# Patient Record
Sex: Male | Born: 1980
Health system: Southern US, Community
[De-identification: ages and names within clinical notes are randomized; demographics above are authoritative.]

## PROBLEM LIST (undated history)

## (undated) DIAGNOSIS — R112 Nausea with vomiting, unspecified: Secondary | ICD-10-CM

## (undated) DIAGNOSIS — Z9889 Other specified postprocedural states: Secondary | ICD-10-CM

## (undated) DIAGNOSIS — M87051 Idiopathic aseptic necrosis of right femur: Secondary | ICD-10-CM

## (undated) DIAGNOSIS — I10 Essential (primary) hypertension: Secondary | ICD-10-CM

## (undated) DIAGNOSIS — M166 Other bilateral secondary osteoarthritis of hip: Secondary | ICD-10-CM

## (undated) DIAGNOSIS — J45909 Unspecified asthma, uncomplicated: Secondary | ICD-10-CM

## (undated) HISTORY — PX: NO PAST SURGERIES: SHX2092

## (undated) HISTORY — PX: HIP SURGERY: SHX245

## (undated) HISTORY — PX: ANKLE FRACTURE SURGERY: SHX122

---

## 1999-10-27 HISTORY — PX: HIP PINNING: SHX1757

## 2009-12-26 HISTORY — PX: ANKLE CLOSED REDUCTION: SHX880

## 2013-12-23 ENCOUNTER — Encounter (HOSPITAL_COMMUNITY): Payer: Self-pay | Admitting: Emergency Medicine

## 2013-12-23 ENCOUNTER — Inpatient Hospital Stay (HOSPITAL_COMMUNITY)
Admission: EM | Admit: 2013-12-23 | Discharge: 2013-12-25 | DRG: 918 | Disposition: A | Discharge status: Other Acute Inpatient Hospital | Payer: Medicare Other | Attending: Internal Medicine | Admitting: Internal Medicine

## 2013-12-23 DIAGNOSIS — T50902A Poisoning by unspecified drugs, medicaments and biological substances, intentional self-harm, initial encounter: Secondary | ICD-10-CM

## 2013-12-23 DIAGNOSIS — I10 Essential (primary) hypertension: Secondary | ICD-10-CM | POA: Diagnosis present

## 2013-12-23 DIAGNOSIS — T4271XA Poisoning by unspecified antiepileptic and sedative-hypnotic drugs, accidental (unintentional), initial encounter: Principal | ICD-10-CM

## 2013-12-23 DIAGNOSIS — T43591A Poisoning by other antipsychotics and neuroleptics, accidental (unintentional), initial encounter: Secondary | ICD-10-CM | POA: Diagnosis present

## 2013-12-23 DIAGNOSIS — F17200 Nicotine dependence, unspecified, uncomplicated: Secondary | ICD-10-CM | POA: Diagnosis present

## 2013-12-23 DIAGNOSIS — Z87828 Personal history of other (healed) physical injury and trauma: Secondary | ICD-10-CM

## 2013-12-23 DIAGNOSIS — J45909 Unspecified asthma, uncomplicated: Secondary | ICD-10-CM | POA: Diagnosis present

## 2013-12-23 DIAGNOSIS — T50901A Poisoning by unspecified drugs, medicaments and biological substances, accidental (unintentional), initial encounter: Secondary | ICD-10-CM

## 2013-12-23 DIAGNOSIS — T426X4A Poisoning by other antiepileptic and sedative-hypnotic drugs, undetermined, initial encounter: Secondary | ICD-10-CM | POA: Diagnosis present

## 2013-12-23 DIAGNOSIS — T50904A Poisoning by unspecified drugs, medicaments and biological substances, undetermined, initial encounter: Secondary | ICD-10-CM

## 2013-12-23 DIAGNOSIS — F411 Generalized anxiety disorder: Secondary | ICD-10-CM | POA: Diagnosis present

## 2013-12-23 DIAGNOSIS — F332 Major depressive disorder, recurrent severe without psychotic features: Secondary | ICD-10-CM | POA: Diagnosis present

## 2013-12-23 DIAGNOSIS — T426X1A Poisoning by other antiepileptic and sedative-hypnotic drugs, accidental (unintentional), initial encounter: Principal | ICD-10-CM | POA: Diagnosis present

## 2013-12-23 DIAGNOSIS — T424X4A Poisoning by benzodiazepines, undetermined, initial encounter: Secondary | ICD-10-CM | POA: Diagnosis present

## 2013-12-23 DIAGNOSIS — G8929 Other chronic pain: Secondary | ICD-10-CM | POA: Diagnosis present

## 2013-12-23 DIAGNOSIS — M87059 Idiopathic aseptic necrosis of unspecified femur: Secondary | ICD-10-CM | POA: Diagnosis present

## 2013-12-23 HISTORY — DX: Unspecified asthma, uncomplicated: J45.909

## 2013-12-23 HISTORY — DX: Essential (primary) hypertension: I10

## 2013-12-23 LAB — COMPREHENSIVE METABOLIC PANEL WITH GFR
ALT: 16 U/L (ref 0–53)
AST: 22 U/L (ref 0–37)
Albumin: 3.9 g/dL (ref 3.5–5.2)
Alkaline Phosphatase: 76 U/L (ref 39–117)
BUN: 13 mg/dL (ref 6–23)
CO2: 25 meq/L (ref 19–32)
Calcium: 8.6 mg/dL (ref 8.4–10.5)
Chloride: 106 meq/L (ref 96–112)
Creatinine, Ser: 0.91 mg/dL (ref 0.50–1.35)
GFR calc Af Amer: >90 mL/min
GFR calc non Af Amer: >90 mL/min
Glucose, Bld: 77 mg/dL (ref 70–99)
Potassium: 3.7 meq/L (ref 3.7–5.3)
Sodium: 144 meq/L (ref 137–147)
Total Bilirubin: 0.5 mg/dL (ref 0.3–1.2)
Total Protein: 6.8 g/dL (ref 6.0–8.3)

## 2013-12-23 LAB — CBC WITH DIFFERENTIAL/PLATELET
Basophils Absolute: 0 10*3/uL (ref 0.0–0.1)
Basophils Relative: 0 % (ref 0–1)
Eosinophils Absolute: 0.5 10*3/uL (ref 0.0–0.7)
Eosinophils Relative: 5 % (ref 0–5)
HCT: 38 % — ABNORMAL LOW (ref 39.0–52.0)
Hemoglobin: 12.4 g/dL — ABNORMAL LOW (ref 13.0–17.0)
LYMPHS ABS: 2.3 10*3/uL (ref 0.7–4.0)
Lymphocytes Relative: 23 % (ref 12–46)
MCH: 29.5 pg (ref 26.0–34.0)
MCHC: 32.6 g/dL (ref 30.0–36.0)
MCV: 90.3 fL (ref 78.0–100.0)
MONOS PCT: 7 % (ref 3–12)
Monocytes Absolute: 0.7 10*3/uL (ref 0.1–1.0)
NEUTROS PCT: 65 % (ref 43–77)
Neutro Abs: 6.7 10*3/uL (ref 1.7–7.7)
PLATELETS: 206 10*3/uL (ref 150–400)
RBC: 4.21 MIL/uL — AB (ref 4.22–5.81)
RDW: 13.2 % (ref 11.5–15.5)
WBC: 10.2 10*3/uL (ref 4.0–10.5)

## 2013-12-23 LAB — I-STAT CHEM 8, ED
BUN: 12 mg/dL (ref 6–23)
Calcium, Ion: 1.1 mmol/L — ABNORMAL LOW (ref 1.12–1.23)
Chloride: 107 mEq/L (ref 96–112)
Creatinine, Ser: 1 mg/dL (ref 0.50–1.35)
Glucose, Bld: 77 mg/dL (ref 70–99)
HCT: 39 % (ref 39.0–52.0)
Hemoglobin: 13.3 g/dL (ref 13.0–17.0)
Potassium: 3.6 mEq/L — ABNORMAL LOW (ref 3.7–5.3)
Sodium: 142 mEq/L (ref 137–147)
TCO2: 23 mmol/L (ref 0–100)

## 2013-12-23 LAB — ACETAMINOPHEN LEVEL: Acetaminophen (Tylenol), Serum: <15 ug/mL (ref 10–30)

## 2013-12-23 LAB — ETHANOL: Alcohol, Ethyl (B): <11 mg/dL (ref 0–11)

## 2013-12-23 LAB — SALICYLATE LEVEL: Salicylate Lvl: <2 mg/dL — ABNORMAL LOW (ref 2.8–20.0)

## 2013-12-23 LAB — CBG MONITORING, ED: Glucose-Capillary: 94 mg/dL (ref 70–99)

## 2013-12-23 MED ORDER — SODIUM CHLORIDE 0.9 % IV BOLUS (SEPSIS)
1000.0000 mL | Freq: Once | INTRAVENOUS | Status: AC
  Administered 2013-12-23: 1000 mL via INTRAVENOUS

## 2013-12-23 NOTE — ED Notes (Addendum)
Talked with poison control, talked with Elnita Maxwell,  MD notified of recommendations.
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
## 2013-12-23 NOTE — ED Notes (Signed)
Pt. Pulled out IV. Stated "she told me too". New IV inserted, patient tolerated well. Requesting pain medicine. Explained reasoning for no pain medicine at this time.

## 2013-12-23 NOTE — ED Notes (Signed)
Per EMS, patient found down by family, AMS, with crushed pills and straw at bedside. Given 1/2 mg narcan, helped with arousal "a little", patient still altered on arrival but awake.

## 2013-12-24 ENCOUNTER — Encounter (HOSPITAL_COMMUNITY): Payer: Self-pay | Admitting: General Practice

## 2013-12-24 DIAGNOSIS — T50901A Poisoning by unspecified drugs, medicaments and biological substances, accidental (unintentional), initial encounter: Secondary | ICD-10-CM

## 2013-12-24 DIAGNOSIS — I10 Essential (primary) hypertension: Secondary | ICD-10-CM | POA: Diagnosis present

## 2013-12-24 DIAGNOSIS — T50902A Poisoning by unspecified drugs, medicaments and biological substances, intentional self-harm, initial encounter: Secondary | ICD-10-CM

## 2013-12-24 DIAGNOSIS — T50904A Poisoning by unspecified drugs, medicaments and biological substances, undetermined, initial encounter: Secondary | ICD-10-CM

## 2013-12-24 LAB — URINALYSIS, ROUTINE W REFLEX MICROSCOPIC
GLUCOSE, UA: NEGATIVE mg/dL
Hgb urine dipstick: NEGATIVE
KETONES UR: 15 mg/dL — AB
LEUKOCYTES UA: NEGATIVE
NITRITE: NEGATIVE
PROTEIN: NEGATIVE mg/dL
Specific Gravity, Urine: 1.036 — ABNORMAL HIGH (ref 1.005–1.030)
Urobilinogen, UA: 1 mg/dL (ref 0.0–1.0)
pH: 6 (ref 5.0–8.0)

## 2013-12-24 LAB — BASIC METABOLIC PANEL
BUN: 13 mg/dL (ref 6–23)
CO2: 25 mEq/L (ref 19–32)
Calcium: 8.5 mg/dL (ref 8.4–10.5)
Chloride: 106 mEq/L (ref 96–112)
Creatinine, Ser: 0.85 mg/dL (ref 0.50–1.35)
Glucose, Bld: 95 mg/dL (ref 70–99)
POTASSIUM: 4.1 meq/L (ref 3.7–5.3)
SODIUM: 143 meq/L (ref 137–147)

## 2013-12-24 LAB — CBC
HCT: 36.6 % — ABNORMAL LOW (ref 39.0–52.0)
HEMOGLOBIN: 11.8 g/dL — AB (ref 13.0–17.0)
MCH: 29.9 pg (ref 26.0–34.0)
MCHC: 32.2 g/dL (ref 30.0–36.0)
MCV: 92.7 fL (ref 78.0–100.0)
Platelets: 177 10*3/uL (ref 150–400)
RBC: 3.95 MIL/uL — AB (ref 4.22–5.81)
RDW: 13.3 % (ref 11.5–15.5)
WBC: 7.4 10*3/uL (ref 4.0–10.5)

## 2013-12-24 MED ORDER — KETOROLAC TROMETHAMINE 30 MG/ML IJ SOLN
30.0000 mg | Freq: Once | INTRAMUSCULAR | Status: AC
  Administered 2013-12-24: 30 mg via INTRAVENOUS
  Filled 2013-12-24: qty 1

## 2013-12-24 MED ORDER — OXYCODONE HCL 5 MG PO TABS
10.0000 mg | ORAL_TABLET | Freq: Two times a day (BID) | ORAL | Status: DC
  Administered 2013-12-24: 10 mg via ORAL
  Filled 2013-12-24 (×3): qty 2

## 2013-12-24 MED ORDER — ALPRAZOLAM 1 MG PO TABS: 1.0000 mg | ORAL_TABLET | Freq: Two times a day (BID) | ORAL | Status: DC

## 2013-12-24 MED ORDER — ACETAMINOPHEN 325 MG PO TABS: 650.0000 mg | ORAL_TABLET | Freq: Four times a day (QID) | ORAL | Status: DC | PRN

## 2013-12-24 MED ORDER — MAGNESIUM HYDROXIDE 400 MG/5ML PO SUSP
15.0000 mL | Freq: Every day | ORAL | Status: DC
  Administered 2013-12-24 – 2013-12-25 (×2): 15 mL via ORAL
  Filled 2013-12-24 (×2): qty 30

## 2013-12-24 MED ORDER — ONDANSETRON HCL 4 MG/2ML IJ SOLN: 4.0000 mg | Freq: Four times a day (QID) | INTRAMUSCULAR | Status: DC | PRN

## 2013-12-24 MED ORDER — NICOTINE 21 MG/24HR TD PT24: 21.0000 mg | MEDICATED_PATCH | Freq: Every day | TRANSDERMAL | Status: DC

## 2013-12-24 MED ORDER — ONDANSETRON HCL 4 MG PO TABS: 4.0000 mg | ORAL_TABLET | Freq: Four times a day (QID) | ORAL | Status: DC | PRN

## 2013-12-24 MED ORDER — BACLOFEN 10 MG PO TABS: 10.0000 mg | ORAL_TABLET | Freq: Three times a day (TID) | ORAL | Status: DC | PRN

## 2013-12-24 MED ORDER — ACETAMINOPHEN 650 MG RE SUPP: 650.0000 mg | Freq: Four times a day (QID) | RECTAL | Status: DC | PRN

## 2013-12-24 MED ORDER — HYDRALAZINE HCL 20 MG/ML IJ SOLN: 10.0000 mg | Freq: Four times a day (QID) | INTRAMUSCULAR | Status: DC | PRN

## 2013-12-24 MED ORDER — ENOXAPARIN SODIUM 40 MG/0.4ML ~~LOC~~ SOLN
40.0000 mg | SUBCUTANEOUS | Status: DC
  Administered 2013-12-24 – 2013-12-25 (×2): 40 mg via SUBCUTANEOUS
  Filled 2013-12-24 (×2): qty 0.4

## 2013-12-24 MED ORDER — BACLOFEN 10 MG PO TABS
10.0000 mg | ORAL_TABLET | Freq: Three times a day (TID) | ORAL | Status: DC
  Administered 2013-12-24 – 2013-12-25 (×4): 10 mg via ORAL
  Filled 2013-12-24 (×6): qty 1

## 2013-12-24 MED ORDER — PANTOPRAZOLE SODIUM 40 MG PO TBEC
40.0000 mg | DELAYED_RELEASE_TABLET | Freq: Every day | ORAL | Status: DC
  Administered 2013-12-24 – 2013-12-25 (×2): 40 mg via ORAL
  Filled 2013-12-24 (×2): qty 1

## 2013-12-24 MED ORDER — SODIUM CHLORIDE 0.9 % IJ SOLN
3.0000 mL | Freq: Two times a day (BID) | INTRAMUSCULAR | Status: DC
  Administered 2013-12-24 – 2013-12-25 (×2): 3 mL via INTRAVENOUS

## 2013-12-24 MED ORDER — SODIUM CHLORIDE 0.9 % IV SOLN
INTRAVENOUS | Status: DC
  Administered 2013-12-24: 02:00:00 via INTRAVENOUS

## 2013-12-24 MED ORDER — ALPRAZOLAM 0.5 MG PO TABS
0.5000 mg | ORAL_TABLET | Freq: Three times a day (TID) | ORAL | Status: DC | PRN
  Administered 2013-12-24: 0.5 mg via ORAL
  Filled 2013-12-24: qty 1

## 2013-12-24 MED ORDER — GABAPENTIN 300 MG PO CAPS
600.0000 mg | ORAL_CAPSULE | Freq: Two times a day (BID) | ORAL | Status: DC
  Administered 2013-12-24 – 2013-12-25 (×3): 600 mg via ORAL
  Filled 2013-12-24 (×4): qty 2

## 2013-12-24 MED ORDER — NICOTINE 21 MG/24HR TD PT24
21.0000 mg | MEDICATED_PATCH | Freq: Every day | TRANSDERMAL | Status: DC
  Administered 2013-12-24 – 2013-12-25 (×2): 21 mg via TRANSDERMAL
  Filled 2013-12-24 (×2): qty 1

## 2013-12-24 MED ORDER — ACETAMINOPHEN 325 MG PO TABS
650.0000 mg | ORAL_TABLET | Freq: Four times a day (QID) | ORAL | Status: DC | PRN
  Administered 2013-12-24: 650 mg via ORAL
  Filled 2013-12-24: qty 2

## 2013-12-24 NOTE — Progress Notes (Signed)
Utilization review completed.  

## 2013-12-24 NOTE — Discharge Summary (Addendum)
Physician Discharge Summary  Douglas Casey UEA:[id number] DOB: May 14, 1981 DOA: 12/23/2013  PCP: No primary provider on file.  Admit date: 12/23/2013 Discharge date: 12/25/2013  Time spent: 45 minutes  Recommendations for Outpatient Follow-up:  1. Patient is medically cleared for discharge. 2. Psychiatry follow up inpatient vs outpatient as deemed appropriate by Psychiatry.  Discharge Diagnoses:  Principal Problem:   Drug overdose, intentional Active Problems:   Hypertension   Overdose   Discharge Condition: stable.  Diet recommendation: general  Filed Weights   12/24/13 0106  Weight: 77.6 kg (171 lb 1.2 oz)    History of present illness:  Douglas Casey is a 33 y.o. male, with past medical history significant for hypertension and chronic pain was found by his family laying down with empty bottles of Ambien oxycodone codeine baclofen and gabapentin. Patient reports he has been miserable since his girlfriend left him for another gentleman.Marland Kitchen He denies trying to commit suicide and he has history of chronic pain. At that time was called and advised observation.   Hospital Course:   Overdose Patient states he is not suicidal or homicidal. He is concerned about receiving his narcotic pain medications.  (we have discontinued these).   Will continue lower dose benzodiazepine PRN. His psychiatrist, Dr. Lynden Ang requested that he be admitted to [facility] when medically cleared.   He is medically cleared.   We have called Psychiatry and Psych Social work to transfer him to [facility] as soon as possible or clear for d/c to home. Sitter at bedside.  HTN BP is now normal. Not on BP medications. Hydralazine PRN.  Chronic pain  Currently holding narcotic pain medications.  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  Tobacco abuse Nicotine patch.   Consultations:  psychiatry  Discharge Exam: Filed Vitals:   12/25/13 0507  BP: 119/73  Pulse: 64  Temp: 97.8 F (36.6 C)   Resp: 18    General: wd, wn, male, nad, slightly sleeply.  Sitter at bedside. Cardiovascular: bradycardic, no m/r/g Respiratory: cta no w/c/r Abdomen:  Soft nt, nd, no masses, +bs Extremities:  5/5 strength in each.  No swelling  Discharge Instructions      Discharge Instructions   Diet - low sodium heart healthy    Complete by:  As directed      Increase activity slowly    Complete by:  As directed             Medication List    STOP taking these medications       Oxycodone HCl 10 MG Tabs      TAKE these medications       acetaminophen 325 MG tablet  Commonly known as:  TYLENOL  Take 2 tablets (650 mg total) by mouth every 6 (six) hours as needed for mild pain (or Fever >/= 101).     albuterol 108 (90 BASE) MCG/ACT inhaler  Commonly known as:  PROVENTIL HFA;VENTOLIN HFA  Inhale 2 puffs into the lungs every 6 (six) hours as needed for wheezing or shortness of breath.     ALPRAZolam 1 MG tablet  Commonly known as:  XANAX  Take 1 tablet (1 mg total) by mouth 2 (two) times daily.     baclofen 10 MG tablet  Commonly known as:  LIORESAL  Take 1 tablet (10 mg total) by mouth 3 (three) times daily as needed for muscle spasms.     butalbital-acetaminophen-caffeine 50-325-40-30 MG per capsule  Commonly known as:  FIORICET WITH CODEINE  Take 1-2 capsules by mouth every 4 (four) hours as needed for headache.     gabapentin 300 MG capsule  Commonly known as:  NEURONTIN  Take 600 mg by mouth 3 (three) times daily.     nicotine 21 mg/24hr patch  Commonly known as:  NICODERM CQ - dosed in mg/24 hours  Place 1 patch (21 mg total) onto the skin daily.       No Known Allergies    The results of significant diagnostics from this hospitalization (including imaging, microbiology, ancillary and laboratory) are listed below for reference.    Significant Diagnostic Studies: No results found.  Labs: Basic Metabolic Panel:  Recent Labs Lab 12/23/13 2130  12/23/13 2144 12/24/13 0559  NA 144 142 143  K 3.7 3.6* 4.1  CL 106 107 106  CO2 25  --  25  GLUCOSE 77 77 95  BUN 13 12 13   CREATININE 0.91 1.00 0.85  CALCIUM 8.6  --  8.5   Liver Function Tests:  Recent Labs Lab 12/23/13 2130  AST 22  ALT 16  ALKPHOS 76  BILITOT 0.5  PROT 6.8  ALBUMIN 3.9   CBC:  Recent Labs Lab 12/23/13 2130 12/23/13 2144 12/24/13 0559  WBC 10.2  --  7.4  NEUTROABS 6.7  --   --   HGB 12.4* 13.3 11.8*  HCT 38.0* 39.0 36.6*  MCV 90.3  --  92.7  PLT 206  --  177   CBG:  Recent Labs Lab 12/23/13 2037  GLUCAP [address]       Signed:  [address] [phone]  [facility] 12/25/2013, 10:10 AM

## 2013-12-24 NOTE — BH Assessment (Signed)
Received call from Pt's psychiatrist, Dr. Lynden Ang, who said Pt overdosed in suicide attempt. Dr. Wynonia Lawman would like Pt admitted to [facility] when Pt is medically cleared. Dr. Wynonia Lawman did not give a contact number and asked for [facility] staff to follow up with his office tomorrow.  Harlin Rain Ria Comment, [facility] Triage Specialist [phone]
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
## 2013-12-24 NOTE — Progress Notes (Signed)
Received pt report from Jody,RN-ED.  

## 2013-12-24 NOTE — ED Provider Notes (Signed)
Pt with altered MS, likely from drug overdose, Pt drowsy, but answers questions slowly.  Will likely admit for obs given amount of pills taken.  Will need psych eval.   Date: 12/24/2013  Rate: 64  Rhythm: normal sinus rhythm  QRS Axis: normal  Intervals: normal  ST/T Wave abnormalities: normal  Conduction Disutrbances:none  Narrative Interpretation:   Old EKG Reviewed: none available    Rolan Bucco, MD 12/24/13 [phone]

## 2013-12-24 NOTE — Discharge Summary (Signed)
Addendum  Patient seen and examined, chart and data base reviewed.  I agree with the above assessment and plan.  For full details please see Mrs. Algis Downs PA note.  Oxycodone, Ambien, gabapentin and codeine overdose, patient presented with lethargy and sleepiness.  He is much better, awake and alert but still sleepy. Can be discharged to behavioral hospital whenever a bed available.   Clint Lipps, MD [facility] Pager: [phone] 12/24/2013, 12:06 PM

## 2013-12-24 NOTE — Progress Notes (Signed)
Pt arrived on unit, drowsy yet alert but not to time, place and situation. Appeared to be in no distress. No SOB noted. RAC IV clean, dry and intact. Placed on suicide precaution, sitter at bedside. Suicide precaution initiated and observed.  Call light placed within reached. On tele box#19. Vital signs taken and stable. Bed at its lowest position. We will continue to monitor.

## 2013-12-25 ENCOUNTER — Inpatient Hospital Stay (HOSPITAL_COMMUNITY)
Admission: AD | Admit: 2013-12-25 | Discharge: 2013-12-31 | DRG: 885 | Disposition: A | Discharge status: Home / Self Care | Payer: Medicare Other | Source: Intra-hospital | Attending: Psychiatry | Admitting: Psychiatry

## 2013-12-25 ENCOUNTER — Encounter (HOSPITAL_COMMUNITY): Payer: Self-pay | Admitting: Behavioral Health

## 2013-12-25 DIAGNOSIS — T481X4A Poisoning by skeletal muscle relaxants [neuromuscular blocking agents], undetermined, initial encounter: Secondary | ICD-10-CM

## 2013-12-25 DIAGNOSIS — T426X1A Poisoning by other antiepileptic and sedative-hypnotic drugs, accidental (unintentional), initial encounter: Secondary | ICD-10-CM

## 2013-12-25 DIAGNOSIS — T426X2A Poisoning by other antiepileptic and sedative-hypnotic drugs, intentional self-harm, initial encounter: Secondary | ICD-10-CM

## 2013-12-25 DIAGNOSIS — Z5987 Material hardship due to limited financial resources, not elsewhere classified: Secondary | ICD-10-CM

## 2013-12-25 DIAGNOSIS — G8929 Other chronic pain: Secondary | ICD-10-CM | POA: Diagnosis present

## 2013-12-25 DIAGNOSIS — F319 Bipolar disorder, unspecified: Secondary | ICD-10-CM

## 2013-12-25 DIAGNOSIS — F411 Generalized anxiety disorder: Secondary | ICD-10-CM | POA: Diagnosis present

## 2013-12-25 DIAGNOSIS — Z598 Other problems related to housing and economic circumstances: Secondary | ICD-10-CM

## 2013-12-25 DIAGNOSIS — Z23 Encounter for immunization: Secondary | ICD-10-CM

## 2013-12-25 DIAGNOSIS — R45851 Suicidal ideations: Secondary | ICD-10-CM

## 2013-12-25 DIAGNOSIS — G4710 Hypersomnia, unspecified: Secondary | ICD-10-CM | POA: Diagnosis present

## 2013-12-25 DIAGNOSIS — T4271XA Poisoning by unspecified antiepileptic and sedative-hypnotic drugs, accidental (unintentional), initial encounter: Principal | ICD-10-CM

## 2013-12-25 DIAGNOSIS — F332 Major depressive disorder, recurrent severe without psychotic features: Secondary | ICD-10-CM

## 2013-12-25 DIAGNOSIS — G4700 Insomnia, unspecified: Secondary | ICD-10-CM | POA: Diagnosis present

## 2013-12-25 DIAGNOSIS — I10 Essential (primary) hypertension: Secondary | ICD-10-CM | POA: Diagnosis present

## 2013-12-25 DIAGNOSIS — T40601A Poisoning by unspecified narcotics, accidental (unintentional), initial encounter: Secondary | ICD-10-CM

## 2013-12-25 DIAGNOSIS — Z91013 Allergy to seafood: Secondary | ICD-10-CM

## 2013-12-25 DIAGNOSIS — T4272XA Poisoning by unspecified antiepileptic and sedative-hypnotic drugs, intentional self-harm, initial encounter: Secondary | ICD-10-CM

## 2013-12-25 DIAGNOSIS — Z5989 Other problems related to housing and economic circumstances: Secondary | ICD-10-CM | POA: Diagnosis not present

## 2013-12-25 DIAGNOSIS — J45909 Unspecified asthma, uncomplicated: Secondary | ICD-10-CM | POA: Diagnosis present

## 2013-12-25 DIAGNOSIS — T50992A Poisoning by other drugs, medicaments and biological substances, intentional self-harm, initial encounter: Secondary | ICD-10-CM

## 2013-12-25 DIAGNOSIS — T394X2A Poisoning by antirheumatics, not elsewhere classified, intentional self-harm, initial encounter: Secondary | ICD-10-CM

## 2013-12-25 DIAGNOSIS — T50902D Poisoning by unspecified drugs, medicaments and biological substances, intentional self-harm, subsequent encounter: Secondary | ICD-10-CM

## 2013-12-25 DIAGNOSIS — T398X2A Poisoning by other nonopioid analgesics and antipyretics, not elsewhere classified, intentional self-harm, initial encounter: Secondary | ICD-10-CM

## 2013-12-25 DIAGNOSIS — F17200 Nicotine dependence, unspecified, uncomplicated: Secondary | ICD-10-CM | POA: Diagnosis present

## 2013-12-25 HISTORY — DX: Other specified postprocedural states: R11.2

## 2013-12-25 HISTORY — DX: Other specified postprocedural states: Z98.890

## 2013-12-25 MED ORDER — ALBUTEROL SULFATE HFA 108 (90 BASE) MCG/ACT IN AERS: 2.0000 | INHALATION_SPRAY | Freq: Four times a day (QID) | RESPIRATORY_TRACT | Status: DC | PRN

## 2013-12-25 MED ORDER — IBUPROFEN 600 MG PO TABS: 600.0000 mg | ORAL_TABLET | Freq: Four times a day (QID) | ORAL | Status: DC | PRN

## 2013-12-25 MED ORDER — HYDROXYZINE HCL 25 MG PO TABS
25.0000 mg | ORAL_TABLET | Freq: Four times a day (QID) | ORAL | Status: AC | PRN
  Administered 2013-12-25 – 2013-12-28 (×4): 25 mg via ORAL
  Filled 2013-12-25 (×4): qty 1

## 2013-12-25 MED ORDER — DICYCLOMINE HCL 20 MG PO TABS: 20.0000 mg | ORAL_TABLET | Freq: Four times a day (QID) | ORAL | Status: DC | PRN

## 2013-12-25 MED ORDER — METHOCARBAMOL 500 MG PO TABS
500.0000 mg | ORAL_TABLET | Freq: Three times a day (TID) | ORAL | Status: AC | PRN
  Administered 2013-12-25 – 2013-12-29 (×5): 500 mg via ORAL
  Filled 2013-12-25 (×5): qty 1

## 2013-12-25 MED ORDER — MAGNESIUM HYDROXIDE 400 MG/5ML PO SUSP: 30.0000 mL | Freq: Every day | ORAL | Status: DC | PRN

## 2013-12-25 MED ORDER — LOPERAMIDE HCL 2 MG PO CAPS: 2.0000 mg | ORAL_CAPSULE | ORAL | Status: AC | PRN

## 2013-12-25 MED ORDER — CLONIDINE HCL 0.1 MG PO TABS: 0.1000 mg | ORAL_TABLET | Freq: Every day | ORAL | Status: DC

## 2013-12-25 MED ORDER — ONDANSETRON 4 MG PO TBDP
4.0000 mg | ORAL_TABLET | Freq: Four times a day (QID) | ORAL | Status: AC | PRN
  Administered 2013-12-26 – 2013-12-27 (×2): 4 mg via ORAL
  Filled 2013-12-25 (×3): qty 1

## 2013-12-25 MED ORDER — CLONIDINE HCL 0.1 MG PO TABS
0.1000 mg | ORAL_TABLET | Freq: Four times a day (QID) | ORAL | Status: DC
  Administered 2013-12-25 – 2013-12-26 (×2): 0.1 mg via ORAL
  Filled 2013-12-25 (×8): qty 1

## 2013-12-25 MED ORDER — DICYCLOMINE HCL 20 MG PO TABS
10.0000 mg | ORAL_TABLET | Freq: Three times a day (TID) | ORAL | Status: DC
  Administered 2013-12-26: 10 mg via ORAL
  Filled 2013-12-25 (×2): qty 1

## 2013-12-25 MED ORDER — TRAZODONE HCL 50 MG PO TABS
50.0000 mg | ORAL_TABLET | Freq: Every evening | ORAL | Status: DC | PRN
  Administered 2013-12-25 – 2013-12-30 (×4): 50 mg via ORAL
  Filled 2013-12-25 (×17): qty 1

## 2013-12-25 MED ORDER — ALUM & MAG HYDROXIDE-SIMETH 200-200-20 MG/5ML PO SUSP: 30.0000 mL | ORAL | Status: DC | PRN

## 2013-12-25 MED ORDER — ALPRAZOLAM 0.5 MG PO TABS
1.0000 mg | ORAL_TABLET | Freq: Two times a day (BID) | ORAL | Status: DC
  Administered 2013-12-25 – 2013-12-31 (×12): 1 mg via ORAL
  Filled 2013-12-25 (×12): qty 2

## 2013-12-25 MED ORDER — CLONIDINE HCL 0.1 MG PO TABS: 0.1000 mg | ORAL_TABLET | ORAL | Status: DC

## 2013-12-25 MED ORDER — NAPROXEN 500 MG PO TABS
500.0000 mg | ORAL_TABLET | Freq: Two times a day (BID) | ORAL | Status: AC | PRN
  Administered 2013-12-26 – 2013-12-30 (×4): 500 mg via ORAL
  Filled 2013-12-25 (×4): qty 1

## 2013-12-25 MED ORDER — NICOTINE 21 MG/24HR TD PT24
21.0000 mg | MEDICATED_PATCH | Freq: Every day | TRANSDERMAL | Status: DC
  Administered 2013-12-26 – 2013-12-31 (×5): 21 mg via TRANSDERMAL
  Filled 2013-12-25 (×9): qty 1

## 2013-12-25 MED ORDER — ACETAMINOPHEN 325 MG PO TABS
650.0000 mg | ORAL_TABLET | Freq: Four times a day (QID) | ORAL | Status: DC | PRN
  Administered 2013-12-28: 650 mg via ORAL
  Filled 2013-12-25: qty 2

## 2013-12-25 NOTE — Progress Notes (Signed)
PROGRESS NOTE  Douglas Casey WCB:[id number] DOB: September 27, 1980 DOA: 12/23/2013 PCP: No primary provider on file.  Medically cleared for discharge or transfer to [facility].  D/c Summary already completed.  Assessment/Plan: Overdose  Patient states he is not suicidal or homicidal.  Narcotics have been discontinued. benzodiazepine PRN.  His psychiatrist, Dr. Lynden Ang requested that he be admitted to [facility] when medically cleared.  He is medically cleared.  We have called Psychiatry and Psych Social work to transfer him to [facility] as soon as possible.  Sitter at bedside.   HTN  BP is now normal.  Not on BP medications.  Hydralazine PRN.   Chronic pain  Currently holding narcotic pain medications.  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   Tobacco abuse  Nicotine patch.      DVT Prophylaxis:  lovenox  Code Status: full Family Communication: patient is alert and orientated and aware of the plan Disposition Plan: discharge to home vs transfer to [facility]  Consultants:  Psych   Procedures:  none  Antibiotics: Anti-infectives   None      none  HPI/Subjective: Douglas Casey is a 33 y.o. male, with past medical history significant for hypertension and chronic pain was found by his family laying down with empty bottles of Ambien oxycodone codeine baclofen and gabapentin. Patient reports he has been miserable since his girlfriend left him for another gentleman.Marland Kitchen He denies trying to commit suicide and he has history of chronic pain. At that time was called and advised observation.   Objective: Filed Vitals:   12/24/13 0603 12/24/13 1500 12/24/13 2052 12/25/13 0507  BP: 127/80 126/76 128/76 119/73  Pulse: 62 72 72 64  Temp: 98 F (36.7 C) 97.7 F (36.5 C) 98.2 F (36.8 C) 97.8 F (36.6 C)  TempSrc: Oral Oral Oral Oral  Resp: 16 18 18 18   Height:      Weight:      SpO2: 97% 96% 98% 98%    Intake/Output Summary (Last 24 hours) at 12/25/13 0850 Last data filed  at 12/24/13 2221  Gross per 24 hour  Intake    600 ml  Output    150 ml  Net    450 ml   Filed Weights   12/24/13 0106  Weight: 77.6 kg (171 lb 1.2 oz)    Exam: General: Well developed, well nourished, NAD, appears stated age.  Appears well HEENT:  PERR, EOMI, Anicteic Sclera, MMM. No pharyngeal erythema or exudates  Neck: Supple, no JVD, no masses  Cardiovascular: RRR, S1 S2 auscultated, no rubs, murmurs or gallops.   Respiratory: Clear to auscultation bilaterally with equal chest rise  Abdomen: Soft, nontender, nondistended, + bowel sounds  Extremities: warm dry without cyanosis clubbing or edema.  Neuro: AAOx3, cranial nerves grossly intact. Strength 5/5 in upper and lower extremities  Skin: Without rashes exudates or nodules.      Data Reviewed: Basic Metabolic Panel:  Recent Labs Lab 12/23/13 2130 12/23/13 2144 12/24/13 0559  NA 144 142 143  K 3.7 3.6* 4.1  CL 106 107 106  CO2 25  --  25  GLUCOSE 77 77 95  BUN 13 12 13   CREATININE 0.91 1.00 0.85  CALCIUM 8.6  --  8.5   Liver Function Tests:  Recent Labs Lab 12/23/13 2130  AST 22  ALT 16  ALKPHOS 76  BILITOT 0.5  PROT 6.8  ALBUMIN 3.9   CBC:  Recent Labs Lab 12/23/13 2130 12/23/13 2144 12/24/13 0559  WBC 10.2  --  7.4  NEUTROABS 6.7  --   --   HGB 12.4* 13.3 11.8*  HCT 38.0* 39.0 36.6*  MCV 90.3  --  92.7  PLT 206  --  177   CBG:  Recent Labs Lab 12/23/13 2037  GLUCAP 94    Studies: No results found.  Scheduled Meds: . baclofen  10 mg Oral TID  . enoxaparin (LOVENOX) injection  40 mg Subcutaneous Q24H  . gabapentin  600 mg Oral BID  . magnesium hydroxide  15 mL Oral Daily  . nicotine  21 mg Transdermal Daily  . pantoprazole  40 mg Oral Daily  . sodium chloride  3 mL Intravenous Q12H   Continuous Infusions:   Principal Problem:   Drug overdose, intentional Active Problems:   Hypertension   Overdose    Conley Canal  [facility] Pager [phone]. If  7PM-7AM, please contact night-coverage at [email or web address], password [facility] 12/25/2013, 8:50 AM  LOS: 2 days
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
## 2013-12-25 NOTE — Progress Notes (Signed)
Addendum  Patient seen and examined, chart and data base reviewed.  I agree with the above assessment and plan.  For full details please see Mrs. Algis Downs PA note.  Drug overdose, unclear intention, psych to recommend disposition home versus inpatient psych.   Clint Lipps, MD [facility] Pager: [phone] 12/25/2013, 11:43 AM

## 2013-12-25 NOTE — Progress Notes (Signed)
Transportation called for pt.

## 2013-12-25 NOTE — Progress Notes (Signed)
Transportation arrived for pt. Pt's belongings and medications given to Lloyd Huger (driver). Verified by Jacquiline Doe RN. Pt going to [employer] [facility].
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
## 2013-12-25 NOTE — Progress Notes (Signed)
Report called and given to Minerva Areola at behavioral health.

## 2013-12-25 NOTE — Progress Notes (Signed)
Attempted to call report to [facility] health.

## 2013-12-26 DIAGNOSIS — T50901A Poisoning by unspecified drugs, medicaments and biological substances, accidental (unintentional), initial encounter: Secondary | ICD-10-CM

## 2013-12-26 DIAGNOSIS — F3130 Bipolar disorder, current episode depressed, mild or moderate severity, unspecified: Secondary | ICD-10-CM

## 2013-12-26 DIAGNOSIS — R45851 Suicidal ideations: Secondary | ICD-10-CM

## 2013-12-26 DIAGNOSIS — T50902A Poisoning by unspecified drugs, medicaments and biological substances, intentional self-harm, initial encounter: Secondary | ICD-10-CM

## 2013-12-26 MED ORDER — DICYCLOMINE HCL 10 MG PO CAPS
10.0000 mg | ORAL_CAPSULE | Freq: Three times a day (TID) | ORAL | Status: DC
  Administered 2013-12-26 – 2013-12-31 (×16): 10 mg via ORAL
  Filled 2013-12-26 (×22): qty 1

## 2013-12-26 MED ORDER — QUETIAPINE FUMARATE 50 MG PO TABS
50.0000 mg | ORAL_TABLET | Freq: Every day | ORAL | Status: DC
  Administered 2013-12-26 – 2013-12-30 (×5): 50 mg via ORAL
  Filled 2013-12-26 (×4): qty 1
  Filled 2013-12-26: qty 4
  Filled 2013-12-26 (×3): qty 1

## 2013-12-26 MED ORDER — DULOXETINE HCL 30 MG PO CPEP
30.0000 mg | ORAL_CAPSULE | Freq: Every day | ORAL | Status: DC
  Administered 2013-12-26 – 2013-12-27 (×2): 30 mg via ORAL
  Filled 2013-12-26 (×5): qty 1
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
## 2013-12-26 NOTE — Progress Notes (Signed)
D: Patient appropriate and cooperative with staff. Patient has anxious mood and affect. He reported on the self inventory sheet that he's sleeping fair, good appetite, energy level is low and ability to pay attention is improving. Patient rates depression and feelings of hopelessness "4". He's participating in groups, interactive with peers and visible in the milieu. Patient compliant with all medications.  A: Support and encouragement provided to patient. Scheduled medications administered per MD orders. Maintain Q15 minute checks for safety.  R: Patient receptive. Denies SI/HI/AVH. Patient remains safe on the unit.

## 2013-12-26 NOTE — Progress Notes (Signed)
D: Pt denies SI/HI/AVH. Pt is pleasant and cooperative. Pt seems really sincere about getting off pain medications. Pt would really like to reduce pain and or control it to a level to increase his quality of life.  A: Pt was offered support and encouragement. Pt was given scheduled medications. Pt was encourage to attend groups. Q 15 minute checks were done for safety.   R:Pt interacts with roommate. Pt is taking medication. Pt has no complaints at this time.Pt receptive to treatment and safety maintained on unit.

## 2013-12-26 NOTE — ED Provider Notes (Signed)
Pt with overdose, multiple meds, unknown quantities.  Pt very drowsy, no airway issues.  Will admit for obs  Rolan Bucco, MD 12/26/13 2133

## 2013-12-26 NOTE — BHH Group Notes (Signed)
[facility] LCSW Group Therapy  Emotional Regulations 1:15 - 2:30  12/26/2013 2:57 PM  Type of Therapy:  Group Therapy  Participation Level:  Did Not Attend   Wynn Banker 12/26/2013, 2:57 PM

## 2013-12-26 NOTE — BHH Group Notes (Signed)
[facility] LCSW Aftercare Discharge Planning Group Note   12/26/2013 12:31 PM    Participation Quality:  [place]ropraite  Mood/Affect:  Appropriate  Depression Rating:   5  Anxiety Rating:  5  Thoughts of Suicide:  No  Will you contract for safety?   NA  Current AVH:  No  Plan for Discharge/Comments:  Patient attended discharge planning group and actively participated in group.  Patient advised of being overwhelmed with life.  He has outpatient services through [facility]. CSW provided all participants with daily workbook.   Transportation Means: Patient does not havetransportation.   Supports:  Patient has a limited support system.   Ramsha Lonigro, Joesph July

## 2013-12-26 NOTE — Progress Notes (Signed)
Did attend group 

## 2013-12-26 NOTE — BHH Suicide Risk Assessment (Signed)
   Nursing information obtained from:  Patient Demographic factors:  Male;Caucasian;Unemployed;Low socioeconomic status Current Mental Status:  NA Loss Factors:  Decline in physical health;Legal issues;Financial problems / change in socioeconomic status Historical Factors:  Family history of suicide;Family history of mental illness or substance abuse Risk Reduction Factors:  Living with another person, especially a relative;Religious beliefs about death;Positive therapeutic relationship Total Time spent with patient: 45 minutes  CLINICAL FACTORS:   Depression:   Aggression Anhedonia Hopelessness Impulsivity Insomnia Recent sense of peace/wellbeing Unstable or Poor Therapeutic Relationship Medical Diagnoses and Treatments/Surgeries  Psychiatric Specialty Exam: Physical Exam  ROS  Blood pressure 94/55, pulse 18, temperature 97.3 F (36.3 C), temperature source Oral, resp. rate 18, height 5\' 10"  (1.778 m), weight 172 lb (78.019 kg).Body mass index is 24.68 kg/(m^2).  General Appearance: Casual, Fairly Groomed and Guarded  Eye Contact::  Minimal  Speech:  Slow  Volume:  Decreased  Mood:  Anxious, Depressed, Dysphoric and Hopeless  Affect:  Constricted, Depressed and Restricted  Thought Process:  Goal Directed  Orientation:  Full (Time, Place, and Person)  Thought Content:  Rumination  Suicidal Thoughts:  Yes.  with intent/plan  Homicidal Thoughts:  No  Memory:  Immediate;   Fair Recent;   Fair Remote;   Fair  Judgement:  Impaired  Insight:  Lacking  Psychomotor Activity:  Decreased  Concentration:  Poor  Recall:  [phone] of Knowledge:Fair  Language: Fair  Akathisia:  No  Handed:  Right  AIMS (if indicated):     Assets:  Communication Skills  Sleep:  Number of Hours: 6.75   Musculoskeletal: Strength & Muscle Tone: within normal limits Gait & Station: patient has difficulty in walking because of pain Patient leans: Front and Backward  COGNITIVE FEATURES THAT  CONTRIBUTE TO RISK:  Closed-mindedness Loss of executive function Polarized thinking Thought constriction (tunnel vision)    SUICIDE RISK:   Severe:  Frequent, intense, and enduring suicidal ideation, specific plan, no subjective intent, but some objective markers of intent (i.e., choice of lethal method), the method is accessible, some limited preparatory behavior, evidence of impaired self-control, severe dysphoria/symptomatology, multiple risk factors present, and few if any protective factors, particularly a lack of social support.  PLAN OF CARE:  I certify that inpatient services furnished can reasonably be expected to improve the patient's condition.  Thomasine Klutts T. 12/26/2013, 8:06 PM

## 2013-12-26 NOTE — BHH Group Notes (Signed)
BHH Group Notes:  (Nursing/MHT/Case Management/Adjunct)  Date:  12/26/2013  Time:  1:29 PM  Type of Therapy:  Psychoeducational Skills  Participation Level:  Did Not Attend  Participation Quality:  N/A  Affect:  N/A  Cognitive:  N/A  Insight:  None  Engagement in Group:  N/A  Modes of Intervention:  N/A  Summary of Progress/Problems: Pt. Did not attend group.   Tylene Fantasia 12/26/2013, 1:29 PM
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
## 2013-12-27 DIAGNOSIS — F322 Major depressive disorder, single episode, severe without psychotic features: Secondary | ICD-10-CM

## 2013-12-27 LAB — RAPID URINE DRUG SCREEN, HOSP PERFORMED
AMPHETAMINES: NOT DETECTED
Barbiturates: POSITIVE — AB
Benzodiazepines: POSITIVE — AB
Cocaine: NOT DETECTED
Opiates: NOT DETECTED
Tetrahydrocannabinol: NOT DETECTED

## 2013-12-27 MED ORDER — ENSURE COMPLETE PO LIQD
237.0000 mL | Freq: Two times a day (BID) | ORAL | Status: DC
  Administered 2013-12-28 – 2013-12-30 (×3): 237 mL via ORAL

## 2013-12-27 MED ORDER — DULOXETINE HCL 60 MG PO CPEP
60.0000 mg | ORAL_CAPSULE | Freq: Every day | ORAL | Status: DC
  Administered 2013-12-28 – 2013-12-31 (×4): 60 mg via ORAL
  Filled 2013-12-27 (×4): qty 1
  Filled 2013-12-27: qty 4
  Filled 2013-12-27: qty 1

## 2013-12-27 NOTE — Progress Notes (Signed)
D: Pt denies SI/HI/AVH. Pt is pleasant and cooperative. Pt was given his black shorts out of locker #22. Pt initialed belongings sheet. String cut out of the shorts. Pt still concerned he's not getting pain medication other than naproxen.   A: Pt was offered support and encouragement. Pt was given scheduled medications. Pt was encourage to attend groups. Q 15 minute checks were done for safety.   R:Pt attends groups and interacts well with peers and staff. Pt is taking medication.Pt receptive to treatment and safety maintained on unit.

## 2013-12-27 NOTE — Progress Notes (Signed)
NUTRITION ASSESSMENT  Pt meets criteria for severe MALNUTRITION in the context of social/environmental circumstances as evidenced by <50% estimated energy intake in the past month with 7% weight loss reported in the past 2 months.  Pt identified as at risk on the Malnutrition Screen Tool  INTERVENTION: 1. Educated patient on the importance of nutrition and encouraged intake of food and beverages. 2. Supplements: Ensure Complete BID 3. Encouraged pt to select bland foods at mealtimes r/t nausea   NUTRITION DIAGNOSIS: Unintentional weight loss related to sub-optimal intake as evidenced by pt report.   Goal: Pt to meet >/= 90% of their estimated nutrition needs.  Monitor:  PO intake  Assessment:  Admitted with overdose with suicidal attempt.   - Pt reports poor intake prior to admission, eating just 1-2 meals/day with 13 pound unintended weight loss in the past 2 months  - States his stomach is still not feeling right since overdose with some nausea however it is improving - Has been eating lightly at mealtimes - Agreeable to getting Ensure Complete during admission    33 y.o. male  Height: Ht Readings from Last 1 Encounters:  12/25/13 5\' 10"  (1.778 m)    Weight: Wt Readings from Last 1 Encounters:  12/25/13 172 lb (78.019 kg)    Weight Hx: Wt Readings from Last 10 Encounters:  12/25/13 172 lb (78.019 kg)  12/24/13 171 lb 1.2 oz (77.6 kg)    BMI:  Body mass index is 24.68 kg/(m^2). Pt meets criteria for normal weight based on current BMI.  Estimated Nutritional Needs: Kcal: 25-30 kcal/kg Protein: > 1 gram protein/kg Fluid: 1 ml/kcal  Diet Order: General Pt is also offered choice of unit snacks mid-morning and mid-afternoon.  Pt is eating as desired.   Lab results and medications reviewed.   12/26/13 MS, RD, LDN [phone] Pager [phone] Weekend/After Hours Pager
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
## 2013-12-27 NOTE — [facility] Group Notes (Signed)
[facility] LCSW Group Therapy  [employer]  1:15 - 2:30 PM  12/27/2013   Type of Therapy:  Group Therapy  Participation Level: Active  Participation Quality:  Attentive  Affect:  Appropriate  Cognitive:  Appropriate  Insight:  Developing/Improving and Engaged  Engagement in Therapy:  Developing/Improving Engaged  Modes of Intervention:  Discussion, Education, Exploration, Problem-Solving, Rapport Building, Support   Summary of Progress/Problems:  Patient listened attentively to speaker from [employer].  He stated he is able to identify with some of the speaker's comment.  He advised of living in [facility] and not having transportation to come to [place] [employer].  Wynn Banker 12/27/2013

## 2013-12-27 NOTE — Progress Notes (Signed)
Recreation Therapy Notes  Animal-Assisted Activity/Therapy (AAA/T) Program Checklist/Progress Notes Patient Eligibility Criteria Checklist & Daily Group note for Rec Tx Intervention  Date: 07.02.2015 Time: 3:15pm Location: [address]   AAA/T Program Assumption of Risk Form signed by Patient/ or Parent Legal Guardian yes  Patient is free of allergies or sever asthma yes  Patient reports no fear of animals yes  Patient reports no history of cruelty to animals yes   Patient understands his/her participation is voluntary yes  Patient washes hands before animal contact yes  Patient washes hands after animal contact yes  Behavioral Response: Appropriate   Education: Hand Washing, Appropriate Animal Interaction   Education Outcome: Acknowledges understanding   Clinical Observations/Feedback: Patient interacted appropriately with therapy dog team.   Jearl Klinefelter, LRT/CTRS  Jaymien Landin L 12/27/2013 4:17 PM

## 2013-12-27 NOTE — Progress Notes (Signed)
D: Patient's affect and mood is very depressed today; pt. verbalized that he "feels really bad because he's withdrawing from taking pain medications for years". Declined self inventory sheet. Writer observed that the patient has been resting in bed all day; no participation in groups. Adheres to medication regimen.  A: Support and encouragement provided to patient. Administered scheduled medications per ordering MD. Monitor Q15 minute checks for safety.  R: Patient receptive. Denies SI/HI/AVH. Patient remains safe.

## 2013-12-28 NOTE — [facility] Group Notes (Signed)
[facility] LCSW Aftercare Discharge Planning Group Note   12/28/2013 12:34 PM    Participation Quality:  Appropraite  Mood/Affect:  Appropriate  Depression Rating:  5  Anxiety Rating:  7  Thoughts of Suicide:  No  Will you contract for safety?   NA  Current AVH:  No  Plan for Discharge/Comments:  Patient attended discharge planning group and actively participated in group. Patient will follow up with [facility].  CSW provided all participants with daily workbook.   Transportation Means: Patient has transportation.   Supports:  Patient has a support system.   Sukhdeep Wieting, Joesph July

## 2013-12-28 NOTE — Progress Notes (Signed)
D: Pt denies SI/HI/AVH. Pt is pleasant and cooperative. Pt stated everything seems to be doing better. Pt concerned about getting back to his life and paying his bills.   A: Pt was offered support and encouragement. Pt was given scheduled medications. Pt was encourage to attend groups. Q 15 minute checks were done for safety.   R:Pt attends groups and interacts well with peers and staff. Pt is taking medication.Pt receptive to treatment and safety maintained on unit.

## 2013-12-28 NOTE — Tx Team (Signed)
Interdisciplinary Treatment Plan Update   Date Reviewed:  12/28/2013  Time Reviewed:  8:32 AM  Progress in Treatment:   Attending groups: Yes Participating in groups: Yes Taking medication as prescribed: Yes  Tolerating medication: Yes Family/Significant other contact made:  No, patient declined collateral contact Patient understands diagnosis: Yes  Discussing patient identified problems/goals with staff: Yes Medical problems stabilized or resolved: Yes Denies suicidal/homicidal ideation: Yes Patient has not harmed self or others: Yes  For review of initial/current patient goals, please see plan of care.  Estimated Length of Stay:  3-4 days  Reasons for Continued Hospitalization:  Anxiety Depression Medication stabilization   New Problems/Goals identified:    Discharge Plan or Barriers:   Home with outpatient follow up scheduled with [facility]  Additional Comments:  Attendees:  Patient:  12/28/2013 8:32 AM   Signature:  Sallyanne Havers, MD 12/28/2013 8:32 AM  Signature:  12/28/2013 8:32 AM  Signature:  Harold Barban, RN 12/28/2013 8:32 AM  Signature: Genelle Gather  12/28/2013 8:32 AM  Signature:  Lamount Cranker,  RN 12/28/2013 8:32 AM  Signature:  Juline Patch, LCSW 12/28/2013 8:32 AM  Signature:   12/28/2013 8:32 AM  Signature:  12/28/2013 8:32 AM   12/28/2013 8:32 AM   12/28/2013  8:32 AM   12/28/2013  8:32 AM   12/28/2013  8:32 AM    Scribe for Treatment Team:   Juline Patch,  12/28/2013 8:32 AM

## 2013-12-28 NOTE — [facility] Group Notes (Signed)
[facility] LCSW Group Therapy  Feelings Around Relapse 1:15 -2:30        12/28/2013   Type of Therapy:  Group Therapy  Participation Level:  Appropriate  Participation Quality:  Appropriate  Affect:  Appropriate  Cognitive:  Attentive Appropriate  Insight:  Developing/Improving  Engagement in Therapy: Developing/Improving  Modes of Intervention:  Discussion Exploration Problem-Solving Supportive  Summary of Progress/Problems:  The topic for today was feelings around relapse.    Patient processed feelings toward relapse and was able to relate to peers.  He shared relapse for him would be focusing on things he has lost instead of trying to move into the future.   Patient identified coping skills that can be used to prevent a relapse.   Wynn Banker 12/28/2013

## 2013-12-28 NOTE — Progress Notes (Signed)
[facility] MD Progress Note  12/28/2013 4:42 PM Douglas Casey  MRN:  [id number] Subjective:  " Im good trying to get focused and come up with a plan. I have to follow through with my plans. I plan on going back to college and wanting to do something with life, and finding a job so that  I am not moping around." Currently rates his depression 3-4/10, anxiety 7/10 his endorses high levels of anxiety due to rapid racing thoughts, and wondering mind. Denies SI/HI/AVH.  Objective : At this time he reports he is feeling better- less depressed, although still sad, which he attributes to chronic pain and current unemployment. He reports actively attending group, and have found them to be very effective. He responds well to support and encouragement. He denies medication side effects currently. He is not currently on opiate analgesics, which he had been taking prior to admission and which he overdosed on. He describes some pain, and a vague feeling of discomfort , but no current significant opiate WDL symptoms.   Diagnosis:  MDD, Severe, no psychotic features  Total Time spent with patient: 20 minutes  ADL's:  Intact  Sleep: improved   Appetite:  Improved   Suicidal Ideation:  Denies suicidal ideations currently Homicidal Ideation:  Denies  AEB (as evidenced by):  Psychiatric Specialty Exam: Physical Exam  Constitutional: He is oriented to person, place, and time. He appears well-developed.  Neck: Normal range of motion.  GI: Soft.  Neurological: He is alert and oriented to person, place, and time.  Skin: Skin is warm and dry.    Review of Systems  Constitutional: Negative for fever and chills.  Musculoskeletal: Positive for joint pain.       Chronic R hip pain  Psychiatric/Behavioral: Positive for depression.    Blood pressure 106/67, pulse 76, temperature 97.3 F (36.3 C), temperature source Oral, resp. rate 16, height 5\' 10"  (1.778 m), weight 78.019 kg (172 lb).Body mass index is 24.68 kg/(m^2).   General Appearance: improved grooming  Eye Contact::  Good  Speech:  Normal Rate  Volume:  Normal  Mood:  Depressed, but reports he feels better   Affect:  Constricted and but reactive   Thought Process:  Goal Directed  Orientation:  Full (Time, Place, and Person)  Thought Content:  denies hallucinations, no delusions  Suicidal Thoughts:  No- at this time denies any SI and contracts for safety on the unit   Homicidal Thoughts:  No  Memory:  NA  Judgement:  Good  Insight:  Fair  Psychomotor Activity:  Normal  Concentration:  Good  Recall:  Good  Fund of Knowledge:Good  Language: Good  Akathisia:  Negative  Handed:  Right  AIMS (if indicated):     Assets:  Communication Skills Desire for Improvement Resilience  Sleep:  Number of Hours: 6.75   Musculoskeletal: Strength & Muscle Tone: within normal limits- patient walks with limp due to chronic hip issues. Denies any recent falls Gait & Station: normal, see above Patient leans: N/A  Current Medications: Current Facility-Administered Medications  Medication Dose Route Frequency Provider Last Rate Last Dose  . acetaminophen (TYLENOL) tablet 650 mg  650 mg Oral Q6H PRN Kerry Hough, PA-C   650 mg at 12/28/13 1637  . albuterol (PROVENTIL HFA;VENTOLIN HFA) 108 (90 BASE) MCG/ACT inhaler 2 puff  2 puff Inhalation Q6H PRN Kerry Hough, PA-C      . ALPRAZolam Prudy Feeler) tablet 1 mg  1 mg Oral BID Kerry Hough, PA-C  1 mg at 12/28/13 1637  . alum & mag hydroxide-simeth (MAALOX/MYLANTA) 200-200-20 MG/5ML suspension 30 mL  30 mL Oral Q4H PRN Kerry Hough, PA-C      . dicyclomine (BENTYL) capsule 10 mg  10 mg Oral TID AC Nehemiah Massed, MD   10 mg at 12/28/13 1637  . DULoxetine (CYMBALTA) DR capsule 60 mg  60 mg Oral Daily Nehemiah Massed, MD   60 mg at 12/28/13 0744  . feeding supplement (ENSURE COMPLETE) (ENSURE COMPLETE) liquid 237 mL  237 mL Oral BID BM Tenny Craw, RD      . hydrOXYzine (ATARAX/VISTARIL) tablet 25 mg   25 mg Oral Q6H PRN Kerry Hough, PA-C   25 mg at 12/27/13 2252  . loperamide (IMODIUM) capsule 2-4 mg  2-4 mg Oral PRN Kerry Hough, PA-C      . magnesium hydroxide (MILK OF MAGNESIA) suspension 30 mL  30 mL Oral Daily PRN Kerry Hough, PA-C      . methocarbamol (ROBAXIN) tablet 500 mg  500 mg Oral Q8H PRN Kerry Hough, PA-C   500 mg at 12/27/13 2252  . naproxen (NAPROSYN) tablet 500 mg  500 mg Oral BID PRN Kerry Hough, PA-C   500 mg at 12/27/13 2252  . nicotine (NICODERM CQ - dosed in mg/24 hours) patch 21 mg  21 mg Transdermal Daily Kerry Hough, PA-C   21 mg at 12/28/13 0744  . ondansetron (ZOFRAN-ODT) disintegrating tablet 4 mg  4 mg Oral Q6H PRN Kerry Hough, PA-C   4 mg at 12/27/13 2252  . QUEtiapine (SEROQUEL) tablet 50 mg  50 mg Oral QHS Cleotis Nipper, MD   50 mg at 12/27/13 2252  . traZODone (DESYREL) tablet 50 mg  50 mg Oral QHS,MR X 1 Kerry Hough, PA-C   50 mg at 12/26/13 2205    Lab Results:  Results for orders placed during the hospital encounter of 12/25/13 (from the past 48 hour(s))  URINE RAPID DRUG SCREEN (HOSP PERFORMED)     Status: Abnormal   Collection Time    12/26/13  4:52 PM      Result Value Ref Range   Opiates NONE DETECTED  NONE DETECTED   Cocaine NONE DETECTED  NONE DETECTED   Benzodiazepines POSITIVE (*) NONE DETECTED   Amphetamines NONE DETECTED  NONE DETECTED   Tetrahydrocannabinol NONE DETECTED  NONE DETECTED   Barbiturates POSITIVE (*) NONE DETECTED   Comment:            DRUG SCREEN FOR MEDICAL PURPOSES     ONLY.  IF CONFIRMATION IS NEEDED     FOR ANY PURPOSE, NOTIFY LAB     WITHIN 5 DAYS.                LOWEST DETECTABLE LIMITS     FOR URINE DRUG SCREEN     Drug Class       Cutoff (ng/mL)     Amphetamine      1000     Barbiturate      200     Benzodiazepine   200     Tricyclics       300     Opiates          300     Cocaine          300     THC              50  Performed at [facility]     Physical Findings: AIMS: Facial and Oral Movements Muscles of Facial Expression: None, normal Lips and Perioral Area: None, normal Jaw: None, normal Tongue: None, normal,Extremity Movements Upper (arms, wrists, hands, fingers): None, normal Lower (legs, knees, ankles, toes): None, normal, Trunk Movements Neck, shoulders, hips: None, normal, Overall Severity Severity of abnormal movements (highest score from questions above): None, normal Incapacitation due to abnormal movements: None, normal Patient's awareness of abnormal movements (rate only patient's report): No Awareness, Dental Status Current problems with teeth and/or dentures?: Yes (2 cavities in upper right side of jaw) Does patient usually wear dentures?: No  CIWA:    COWS:  COWS Total Score: 4  Assessment: Patient's mood and affect are improving gradually, although he remains depressed, which he attributes to chronic hip pain, disability, lack of daily structure. He states he is no  Longer suicidal and has a desire to live. He is tolerating Cymbalta well thus far. No significant opiate WDL symptoms  noted.  Treatment Plan Summary: Daily contact with patient to assess and evaluate symptoms and progress in treatment Medication management See below   Plan:  Increase Cymbalta to 60 mgrs QDAY.  Continue to provide milieu, support, group therapies  Medical Decision Making Problem Points:  Established problem, stable/improving (1), Review of last therapy session (1) and Review of psycho-social stressors (1) Data Points:  Review of new medications or change in dosage (2)  I certify that inpatient services furnished can reasonably be expected to improve the patient's condition.   Malachy Chamber S FNP-BC 12/28/2013, 4:42 PM

## 2013-12-28 NOTE — Progress Notes (Signed)
D: Patient appropriate and cooperative with staff. Continues to have depressed affect and mood. He reported on the self inventory sheet that he's sleeping poorly, improving appetite, normal energy level and good ability to pay attention. Patient rates depression and feelings of hopelessness "4". He's not attending groups at this time, however he does report that a change he would like to make to better care for self once discharging is to find other alternatives for pain management. In compliance with all medications.  A: Support and encouragement provided to patient. Scheduled medications administered per MD orders. Maintain Q15 minute checks for safety.  R: Patient receptive. Denies SI/HI and AVH. Patient remains safe on the unit.

## 2013-12-29 DIAGNOSIS — F332 Major depressive disorder, recurrent severe without psychotic features: Principal | ICD-10-CM

## 2013-12-29 MED ORDER — KETOROLAC TROMETHAMINE 60 MG/2ML IM SOLN
60.0000 mg | Freq: Once | INTRAMUSCULAR | Status: AC
  Administered 2013-12-29: 60 mg via INTRAMUSCULAR
  Filled 2013-12-29: qty 2

## 2013-12-29 MED ORDER — PNEUMOCOCCAL VAC POLYVALENT 25 MCG/0.5ML IJ INJ
0.5000 mL | INJECTION | INTRAMUSCULAR | Status: AC
  Administered 2013-12-30: 0.5 mL via INTRAMUSCULAR

## 2013-12-29 MED ORDER — BENZOCAINE 10 % MT GEL
Freq: Four times a day (QID) | OROMUCOSAL | Status: DC | PRN
  Administered 2013-12-29 (×2): via OROMUCOSAL
  Filled 2013-12-29: qty 9.4

## 2013-12-29 MED ORDER — AMOXICILLIN-POT CLAVULANATE 875-125 MG PO TABS
1.0000 | ORAL_TABLET | Freq: Two times a day (BID) | ORAL | Status: DC
  Administered 2013-12-29 – 2013-12-31 (×5): 1 via ORAL
  Filled 2013-12-29 (×4): qty 1
  Filled 2013-12-29: qty 14
  Filled 2013-12-29: qty 1
  Filled 2013-12-29: qty 14
  Filled 2013-12-29 (×3): qty 1

## 2013-12-29 MED ORDER — HYDROCODONE-ACETAMINOPHEN 5-325 MG PO TABS
1.0000 | ORAL_TABLET | Freq: Every evening | ORAL | Status: DC | PRN
  Administered 2013-12-29: 1 via ORAL
  Filled 2013-12-29: qty 1
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
## 2013-12-29 NOTE — [facility] Group Notes (Signed)
[facility] Group Notes:  (Nursing/MHT/Case Management/Adjunct)  Date:  12/29/2013  Time:  12:00 PM  Type of Therapy:  Psychoeducational Skills  Participation Level:  Did Not Attend  Douglas Casey 12/29/2013, 12:00 PM

## 2013-12-29 NOTE — Progress Notes (Signed)
[facility] Group Notes:  (Nursing/MHT/Case Management/Adjunct)  Date:  12/29/2013  Time:  12:07 AM  Type of Therapy:  Group Therapy  Participation Level:  Active  Participation Quality:  Appropriate  Affect:  Appropriate  Cognitive:  Appropriate  Insight:  Appropriate  Engagement in Group:  Engaged  Modes of Intervention:  Socialization and Support  Summary of Progress/Problems: Pt. Stated his energy level was a 4.  Pt. Stated anger and not taking medication correctly Pt. Stated deep breathing techniques was a coping skill for anger.  Sondra Come 12/29/2013, 12:07 AM

## 2013-12-29 NOTE — Progress Notes (Signed)
Psychoeducational Group Note  Date: 12/29/2013 Time:  1015  Group Topic/Focus:  Identifying Needs:   The focus of this group is to help patients identify their personal needs that have been historically problematic and identify healthy behaviors to address their needs.  Participation Level:  Active  Participation Quality:  Appropriate  Affect:  Appropriate  Cognitive:  Oriented  Insight:  Improving  Engagement in Group:  Engaged  Additional Comments:  Pt attended and was involved in the discussions. Added much to the group  Koral Thaden A  

## 2013-12-29 NOTE — [facility] Group Notes (Signed)
Adult Psychoeducational Group Note  Date:  12/29/2013 Time:  11:10 PM  Group Topic/Focus:  Wrap-Up Group:   The focus of this group is to help patients review their daily goal of treatment and discuss progress on daily workbooks.  Participation Level:  Active  Participation Quality:  Attentive, Sharing and Supportive  Affect:  Appropriate  Cognitive:  Appropriate  Insight: Good  Engagement in Group:  Supportive  Modes of Intervention:  Support  Additional Comments:  Pt participated in group this evening.  He reports his goal to manage his pain daily using coping skills.  He reports 2 positive things about himself including his health and ability to share with others.  [name], Karmella Bouvier L 12/29/2013, 11:10 PM

## 2013-12-29 NOTE — Progress Notes (Signed)
[facility] MD Progress Note  12/29/2013 10:52 AM Douglas Casey  MRN:  [id number] Subjective:  " I am felling pretty good honestly, but when you have chronic pain it is something tough to deal with. And then my tooth flared up it seems like I cant catch a break." Currently rates his depression 3/10, anxiety 6/10 his endorses high levels of anxiety due to things he has to deal with when he is discharged. Denies SI/HI/AVH. He is c/o R upper tooth pain and swelling. Has a hx of dental caries, which he is scheduled to have removed this month. Currently under the care of [facility]. Objective : At this time he reports he is feeling better- less depressed, although still sad, which he attributes to chronic pain and current unemployment. He reports missing group,  Due to pain. He responds well to support and encouragement. He denies medication side effects currently.  Diagnosis:  MDD, Severe, no psychotic features  Total Time spent with patient: 20 minutes  ADL's:  Intact  Sleep: improved   Appetite:  Improved   Suicidal Ideation:  Denies suicidal ideations currently Homicidal Ideation:  Denies  AEB (as evidenced by):  Psychiatric Specialty Exam: Physical Exam  Constitutional: He is oriented to person, place, and time. He appears well-developed.  Neck: Normal range of motion.  GI: Soft.  Neurological: He is alert and oriented to person, place, and time.  Skin: Skin is warm and dry.    Review of Systems  Constitutional: Negative for fever and chills.  HENT:       Tooth pain  Musculoskeletal: Positive for joint pain.       Chronic R hip pain  Psychiatric/Behavioral: Positive for depression.    Blood pressure 125/86, pulse 73, temperature 97.7 F (36.5 C), temperature source Oral, resp. rate 16, height 5\' 10"  (1.778 m), weight 78.019 kg (172 lb).Body mass index is 24.68 kg/(m^2).  General Appearance: improved grooming  Eye Contact::  Good  Speech:  Normal Rate  Volume:  Normal   Mood:  Depressed, but reports he feels better   Affect:  Constricted and but reactive   Thought Process:  Goal Directed  Orientation:  Full (Time, Place, and Person)  Thought Content:  denies hallucinations, no delusions  Suicidal Thoughts:  No- at this time denies any SI and contracts for safety on the unit   Homicidal Thoughts:  No  Memory:  NA  Judgement:  Good  Insight:  Fair  Psychomotor Activity:  Normal  Concentration:  Good  Recall:  Good  Fund of Knowledge:Good  Language: Good  Akathisia:  Negative  Handed:  Right  AIMS (if indicated):     Assets:  Communication Skills Desire for Improvement Resilience  Sleep:  Number of Hours: 6.75   Musculoskeletal: Strength & Muscle Tone: within normal limits- patient walks with limp due to chronic hip issues. Denies any recent falls Gait & Station: normal, see above Patient leans: N/A  Current Medications: Current Facility-Administered Medications  Medication Dose Route Frequency Provider Last Rate Last Dose  . acetaminophen (TYLENOL) tablet 650 mg  650 mg Oral Q6H PRN [id number], PA-C   650 mg at 12/28/13 1637  . albuterol (PROVENTIL HFA;VENTOLIN HFA) 108 (90 BASE) MCG/ACT inhaler 2 puff  2 puff Inhalation Q6H PRN 02/28/14, PA-C      . ALPRAZolam Kerry Hough) tablet 1 mg  1 mg Oral BID Prudy Feeler, PA-C   1 mg at 12/29/13 0901  . alum &  mag hydroxide-simeth (MAALOX/MYLANTA) 200-200-20 MG/5ML suspension 30 mL  30 mL Oral Q4H PRN Kerry Hough, PA-C      . dicyclomine (BENTYL) capsule 10 mg  10 mg Oral TID AC Nehemiah Massed, MD   10 mg at 12/29/13 8453  . DULoxetine (CYMBALTA) DR capsule 60 mg  60 mg Oral Daily Nehemiah Massed, MD   60 mg at 12/29/13 0901  . feeding supplement (ENSURE COMPLETE) (ENSURE COMPLETE) liquid 237 mL  237 mL Oral BID BM Tenny Craw, RD   237 mL at 12/28/13 2133  . hydrOXYzine (ATARAX/VISTARIL) tablet 25 mg  25 mg Oral Q6H PRN Kerry Hough, PA-C   25 mg at 12/28/13 2134  . loperamide  (IMODIUM) capsule 2-4 mg  2-4 mg Oral PRN Kerry Hough, PA-C      . magnesium hydroxide (MILK OF MAGNESIA) suspension 30 mL  30 mL Oral Daily PRN Kerry Hough, PA-C      . methocarbamol (ROBAXIN) tablet 500 mg  500 mg Oral Q8H PRN Kerry Hough, PA-C   500 mg at 12/28/13 2134  . naproxen (NAPROSYN) tablet 500 mg  500 mg Oral BID PRN Kerry Hough, PA-C   500 mg at 12/28/13 2133  . nicotine (NICODERM CQ - dosed in mg/24 hours) patch 21 mg  21 mg Transdermal Daily Kerry Hough, PA-C   21 mg at 12/29/13 6468  . ondansetron (ZOFRAN-ODT) disintegrating tablet 4 mg  4 mg Oral Q6H PRN Kerry Hough, PA-C   4 mg at 12/27/13 2252  . QUEtiapine (SEROQUEL) tablet 50 mg  50 mg Oral QHS Cleotis Nipper, MD   50 mg at 12/28/13 2134  . traZODone (DESYREL) tablet 50 mg  50 mg Oral QHS,MR X 1 Kerry Hough, PA-C   50 mg at 12/26/13 2205    Lab Results:  No results found for this or any previous visit (from the past 48 hour(s)).  Physical Findings: AIMS: Facial and Oral Movements Muscles of Facial Expression: None, normal Lips and Perioral Area: None, normal Jaw: None, normal Tongue: None, normal,Extremity Movements Upper (arms, wrists, hands, fingers): None, normal Lower (legs, knees, ankles, toes): None, normal, Trunk Movements Neck, shoulders, hips: None, normal, Overall Severity Severity of abnormal movements (highest score from questions above): None, normal Incapacitation due to abnormal movements: None, normal Patient's awareness of abnormal movements (rate only patient's report): No Awareness, Dental Status Current problems with teeth and/or dentures?: No Does patient usually wear dentures?: No  CIWA:    COWS:  COWS Total Score: 4  Assessment: Patient's mood and affect are improving gradually, although he remains depressed, which he attributes to chronic hip pain, disability, lack of daily structure. He states he is no  Longer suicidal and has a desire to live. He is tolerating  Cymbalta well thus far. No significant opiate WDL symptoms  noted.  Treatment Plan Summary: Daily contact with patient to assess and evaluate symptoms and progress in treatment Medication management See below   Plan:  Increase Cymbalta to 60 mgrs QDAY.  Continue to provide milieu, support, group therapies Augmentin 875mg  1 tablet po BID x 7 days. Orajel for tooth and dental pain QID prn. Will prescribe Norco 5/325mg  1 tablet po QHS prn for pain, will re-evaluate in the morning. Pt understands that he will not get a prescription when he leaves this facility for the Norco. Toradol 60mg  Im in a single dose  Medical Decision Making Problem Points:  Established problem, stable/improving (  1), Review of last therapy session (1) and Review of psycho-social stressors (1) Data Points:  Review of new medications or change in dosage (2)  I certify that inpatient services furnished can reasonably be expected to improve the patient's condition.   Malachy Chamber S FNP-BC 12/29/2013, 10:52 AM I agree with assessment and plan Madie Reno A. Dub Mikes, M.D.

## 2013-12-30 DIAGNOSIS — F332 Major depressive disorder, recurrent severe without psychotic features: Secondary | ICD-10-CM | POA: Diagnosis not present

## 2013-12-30 MED ORDER — HYDROCODONE-ACETAMINOPHEN 5-325 MG PO TABS
1.0000 | ORAL_TABLET | Freq: Three times a day (TID) | ORAL | Status: DC | PRN
  Administered 2013-12-30 – 2013-12-31 (×2): 1 via ORAL
  Filled 2013-12-30 (×3): qty 1

## 2013-12-30 NOTE — [facility] Group Notes (Signed)
[facility] Group Notes:  (Clinical Social Work)  12/30/2013   1:15-2:15PM  Summary of Progress/Problems:  The main focus of today's process group was to   identify the patient's current support system and decide on other supports that can be put in place.  The picture on workbook was used to discuss why additional supports are needed.  An emphasis was placed on using counselor, doctor, therapy groups, 12-step groups, and problem-specific support groups to expand supports.   There was also an extensive discussion about what constitutes a healthy support versus an unhealthy support.  The patient expressed full comprehension of the concepts presented.  He stated that his motivation to add new supports in order to change his life for a better outcome is 7 out of 10.  It is that number because he has a lot of roadblocks, he says, and he dreads having to deal with them although he also realizes it is reality and thus he has to do so. He wants to go to support groups, as he has found this format very helpful to him while in the hospital.  He talked about his chronic pain and his plan to figure out "how much I can use to self-medicate without using too much, I just want to balance it properly."  Type of Therapy:  Process Group  Participation Level:  Active  Participation Quality:  Attentive and Sharing  Affect:  Blunted  Cognitive:  Appropriate and Oriented  Insight:  Developing/Improving  Engagement in Therapy:  Engaged  Modes of Intervention:  Education,  Support and [employer], LCSW 12/30/2013, 4:00pm

## 2013-12-30 NOTE — Progress Notes (Signed)
Patient ID: Douglas Casey, male   DOB: 05-01-1981, 32 y.o.   MRN: [id number] D)  Has been participating in the milieu this evening, came to the med window to ask for something for his toothache and was given vicodin with partial relief.  Has been pleasant and cooperative in spite of discomfort. Attended group this evening, Stated his goal was to get himself in better spirits and get out of his slump, stated was thankful for the health he has.  Interacting appropriately with staff and peers, rather quiet , depressed, states pain never quite goes away, and too young to be a candidate for a hip replacement yet. A)  Was offered support and encouragement, Will continue to monitor for safety, continue POC R)  Safety maintained.

## 2013-12-30 NOTE — Progress Notes (Signed)
Psychoeducational Group Note  Date: 12/30/2013 Time:  0930  Group Topic/Focus:  Gratefulness:  The focus of this group is to help patients identify what two things they are most grateful for in their lives. What helps ground them and to center them on their work to their recovery.  Participation Level:  Active  Participation Quality:  Appropriate  Affect:  Appropriate  Cognitive:  Oriented  Insight:  Improving  Engagement in Group:  Engaged  Additional Comments:    Douglas Casey A  

## 2013-12-30 NOTE — Progress Notes (Signed)
D) Pt has attended the groups and interacts with his peers appropriately. Rates his depression at 3 and his hopelessness at a 4. Denies SI and HI. Pt has been dealing with his pain throughout the day and this afternoon was no longer able to deal with it. Requested something stronger to help him. Request was made of the NP who prescribed Norco 5/325 for pains tooth pain. Area on right side of face remains swollen. Pt states he is learning a lot here and is happy that he came. States he is looking at things differently now A) Provided with a 1:1. Given support, reassurance and praise. Encouragement given.  R) Pt states he is feeling a bit better after the pain medications was givne. Hoping to be discharged soon so he will be able to attend to his tooth.

## 2013-12-30 NOTE — Progress Notes (Signed)
[facility] MD Progress Note  12/30/2013 10:58 AM Douglas Casey  MRN:  [id number] Subjective:  " I am feeling pretty good, I am actually getting some strength back. He notes improvement in dental pain, and states he was actually able to eat breakfast.  He reports compliance with medication and denies any side effects at this time. He reports good relief with Toradol injection yesterday for dental pain, and chronic pain. He reports feeling immediate relief within 10 minutes. "I was feeling weak, and I actually believe by me eating that it is help my strength and my character". Currently rates his depression 3/10, anxiety 5/10. He reports active participation in groups, and he is now getting used to people and opening up. Denies SI/HI/AVH.   Objective : At this time he reports he is feeling better- more engaged with his peers and staff. He reports attending groups. He responds well to support and encouragement.   Diagnosis:  MDD, Severe, no psychotic features  Total Time spent with patient: 20 minutes  ADL's:  Intact  Sleep: fair  Appetite:  Improved   Suicidal Ideation:  Denies suicidal ideations currently Homicidal Ideation:  Denies  AEB (as evidenced by):  Psychiatric Specialty Exam: Physical Exam  Constitutional: He is oriented to person, place, and time. He appears well-developed.  Neck: Normal range of motion.  GI: Soft.  Neurological: He is alert and oriented to person, place, and time.  Skin: Skin is warm and dry.    Review of Systems  Constitutional: Negative for fever and chills.  HENT:       Tooth pain  Musculoskeletal: Positive for joint pain.       Chronic R hip pain  Psychiatric/Behavioral: Positive for depression.    Blood pressure 127/78, pulse 99, temperature 96.4 F (35.8 C), temperature source Oral, resp. rate 18, height 5\' 10"  (1.778 m), weight 78.019 kg (172 lb).Body mass index is 24.68 kg/(m^2).  General Appearance: Fairly Groomed  ::  Good  Speech:   Normal Rate  Volume:  Normal  Mood:  Euthymic  Affect:  Appropriate and Congruent  Thought Process:  Goal Directed  Orientation:  Full (Time, Place, and Person)  Thought Content:  denies hallucinations, no delusions  Suicidal Thoughts:  No- at this time denies any SI and contracts for safety on the unit   Homicidal Thoughts:  No  Memory:  NA  Judgement:  Good  Insight:  Fair  Psychomotor Activity:  Normal  Concentration:  Good  Recall:  Good  Fund of Knowledge:Good  Language: Good  Akathisia:  Negative  Handed:  Right  AIMS (if indicated):     Assets:  Communication Skills Desire for Improvement Resilience  Sleep:  Number of Hours: 6   Musculoskeletal: Strength & Muscle Tone: within normal limits- patient walks with limp due to chronic hip issues. Denies any recent falls Gait & Station: normal, see above Patient leans: N/A  Current Medications: Current Facility-Administered Medications  Medication Dose Route Frequency Provider Last Rate Last Dose  . acetaminophen (TYLENOL) tablet 650 mg  650 mg Oral Q6H PRN [id number], PA-C   650 mg at 12/28/13 1637  . albuterol (PROVENTIL HFA;VENTOLIN HFA) 108 (90 BASE) MCG/ACT inhaler 2 puff  2 puff Inhalation Q6H PRN 02/28/14, PA-C      . ALPRAZolam Kerry Hough) tablet 1 mg  1 mg Oral BID Prudy Feeler, PA-C   1 mg at 12/30/13 0809  . alum & mag hydroxide-simeth (MAALOX/MYLANTA) 200-200-20 MG/5ML suspension 30  mL  30 mL Oral Q4H PRN Kerry Hough, PA-C      . amoxicillin-clavulanate (AUGMENTIN) 875-125 MG per tablet 1 tablet  1 tablet Oral Q12H Truman Hayward, FNP   1 tablet at 12/29/13 2045  . benzocaine (ORAJEL) 10 % mucosal gel   Mouth/Throat QID PRN Truman Hayward, FNP      . dicyclomine (BENTYL) capsule 10 mg  10 mg Oral TID [facility] Nehemiah Massed, MD   10 mg at 12/30/13 0160  . DULoxetine (CYMBALTA) DR capsule 60 mg  60 mg Oral Daily Nehemiah Massed, MD   60 mg at 12/30/13 0809  . feeding supplement (ENSURE COMPLETE) (ENSURE  COMPLETE) liquid 237 mL  237 mL Oral BID BM Tenny Craw, RD   237 mL at 12/29/13 1344  . HYDROcodone-acetaminophen (NORCO/VICODIN) 5-325 MG per tablet 1 tablet  1 tablet Oral QHS PRN Truman Hayward, FNP   1 tablet at 12/29/13 2045  . hydrOXYzine (ATARAX/VISTARIL) tablet 25 mg  25 mg Oral Q6H PRN Kerry Hough, PA-C   25 mg at 12/28/13 2134  . loperamide (IMODIUM) capsule 2-4 mg  2-4 mg Oral PRN Kerry Hough, PA-C      . magnesium hydroxide (MILK OF MAGNESIA) suspension 30 mL  30 mL Oral Daily PRN Kerry Hough, PA-C      . methocarbamol (ROBAXIN) tablet 500 mg  500 mg Oral Q8H PRN Kerry Hough, PA-C   500 mg at 12/29/13 2214  . naproxen (NAPROSYN) tablet 500 mg  500 mg Oral BID PRN Kerry Hough, PA-C   500 mg at 12/30/13 1093  . nicotine (NICODERM CQ - dosed in mg/24 hours) patch 21 mg  21 mg Transdermal Daily Kerry Hough, PA-C   21 mg at 12/30/13 0810  . ondansetron (ZOFRAN-ODT) disintegrating tablet 4 mg  4 mg Oral Q6H PRN Kerry Hough, PA-C   4 mg at 12/27/13 2252  . pneumococcal 23 valent vaccine (PNU-IMMUNE) injection 0.5 mL  0.5 mL Intramuscular [address], PA-C      . QUEtiapine (SEROQUEL) tablet 50 mg  50 mg Oral QHS Cleotis Nipper, MD   50 mg at 12/29/13 2211  . traZODone (DESYREL) tablet 50 mg  50 mg Oral QHS,MR X 1 Kerry Hough, PA-C   50 mg at 12/29/13 2211    Lab Results:  No results found for this or any previous visit (from the past 48 hour(s)).  Physical Findings: AIMS: Facial and Oral Movements Muscles of Facial Expression: None, normal Lips and Perioral Area: None, normal Jaw: None, normal Tongue: None, normal,Extremity Movements Upper (arms, wrists, hands, fingers): None, normal Lower (legs, knees, ankles, toes): None, normal, Trunk Movements Neck, shoulders, hips: None, normal, Overall Severity Severity of abnormal movements (highest score from questions above): None, normal Incapacitation due to abnormal movements: None,  normal Patient's awareness of abnormal movements (rate only patient's report): No Awareness, Dental Status Current problems with teeth and/or dentures?: No Does patient usually wear dentures?: No  CIWA:    COWS:  COWS Total Score: 4  Assessment: Patient's mood and affect are improving gradually, although he remains depressed, which he attributes to chronic hip pain, disability, lack of daily structure. He is tolerating Cymbalta well thus far.  No significant opiate WDL symptoms  noted.  Treatment Plan Summary: Daily contact with patient to assess and evaluate symptoms and progress in treatment Medication management See below   Plan:  Increase Cymbalta to 60  mgrs QDAY.  Continue to provide milieu, support, group therapies Augmentin 875mg  1 tablet po BID x 7 days. Orajel for tooth and dental pain QID prn. Will prescribe Norco 5/325mg  1 tablet po QHS prn for pain, will re-evaluate in the morning. Pt understands that he will not get a prescription when he leaves this facility for the Norco. Toradol 60mg  Im in a single dose Pt encouraged to complete entire dose of antibiotic even if feels better.   Medical Decision Making Problem Points:  Established problem, stable/improving (1), Review of last therapy session (1) and Review of psycho-social stressors (1) Data Points:  Review of new medications or change in dosage (2)  I certify that inpatient services furnished can reasonably be expected to improve the patient's condition.   S FNP-BC 12/30/2013, 10:58 AM

## 2013-12-30 NOTE — Progress Notes (Signed)
Staff RN is inquiring about increasing pain medication (norco) for patient. States he is crying, and pain is throbbing and moving up his face. He noted relief with Toradol IM yesterday and naproxen given this morning. Considering patients hx of suicide attempt by OD on pain medication, will prescribe Norco 5/325 TID prn for moderate to severe pain. Pt is currently on Augmentin 875mg  1 tablet po BID x 7days, Orajel QID prn for dental pain. Order given for salt/water gargles and rinses due to breath odor noted by nursing staff. Will re-emphasize that patient will not be sent home on any pain medication due to safety concerns.

## 2013-12-30 NOTE — Progress Notes (Signed)
Adult Psychoeducational Group Note  Date:  12/30/2013 Time:  9:52 PM  Group Topic/Focus:  Wrap-Up Group:   The focus of this group is to help patients review their daily goal of treatment and discuss progress on daily workbooks.  Participation Level:  Active  Participation Quality:  Attentive  Affect:  Appropriate  Cognitive:  Appropriate  Insight: Good and Improving  Engagement in Group:  Engaged  Modes of Intervention:  Clarification, Discussion and Exploration  Additional Comments:    Lorin Mercy 12/30/2013, 9:52 PM

## 2013-12-30 NOTE — Progress Notes (Signed)
Psychoeducational Group Note  Date:  12/30/2013 Time:  1015  Group Topic/Focus:  Making Healthy Choices:   The focus of this group is to help patients identify negative/unhealthy choices they were using prior to admission and identify positive/healthier coping strategies to replace them upon discharge.  Participation Level:  Active  Participation Quality:  Appropriate  Affect:  Appropriate  Cognitive:  Oriented  Insight:  Improving  Engagement in Group:  Engaged  Additional Comments:    Pius Byrom A 12/30/2013 

## 2013-12-31 MED ORDER — BENZOCAINE 10 % MT GEL: Freq: Four times a day (QID) | OROMUCOSAL | Status: DC | PRN

## 2013-12-31 MED ORDER — DULOXETINE HCL 60 MG PO CPEP: 60.0000 mg | ORAL_CAPSULE | Freq: Every day | ORAL | Status: DC

## 2013-12-31 MED ORDER — AMOXICILLIN-POT CLAVULANATE 875-125 MG PO TABS: 1.0000 | ORAL_TABLET | Freq: Two times a day (BID) | ORAL | Status: DC

## 2013-12-31 MED ORDER — QUETIAPINE FUMARATE 50 MG PO TABS: 50.0000 mg | ORAL_TABLET | Freq: Every day | ORAL | Status: DC

## 2013-12-31 MED ORDER — ALPRAZOLAM 1 MG PO TABS: 1.0000 mg | ORAL_TABLET | Freq: Two times a day (BID) | ORAL | Status: DC

## 2013-12-31 MED ORDER — HYDROCODONE-ACETAMINOPHEN 5-325 MG PO TABS: 1.0000 | ORAL_TABLET | Freq: Three times a day (TID) | ORAL | Status: DC | PRN
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
## 2013-12-31 NOTE — Progress Notes (Signed)
Discharge Note:  Patient discharged to home.  Denied SI and HI.  Denied A/V hallucinations.  Suicide prevention information given and discussed with patient who stated he understood and had no questions.  Patient stated he received all his belongings, clothing, misc items, personal items, prescriptions, medications.  Patient stated he appreciated all assistance received from [facility] staff.

## 2013-12-31 NOTE — Progress Notes (Signed)
Patient ID: Douglas Casey, male   DOB: [birth year], 33 y.o.   MRN: [id number] D)  States has begun to feel a little better this evening, his pain level isn't as high, can tell the augmentin is helping his tooth, and feels better than he has for a week. Has spent most of the evening in the dayroom, attended group and stayed to play games of checkers, chess, and has enjoyed the interactions.  Has been laughing and brighter this evening.  Came to med window afterward for hs meds,  Talked about going back to school, wanting to get his life back on track and is starting to think about the steps he wants to take, and possibly including a hip replacement. Feels is here for a reason, ready to find his purpose. A)  Support and encouragement, continue to monitor for safety, continue POC R)  Safety maintained.
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
## 2013-12-31 NOTE — [facility] Group Notes (Signed)
[facility] LCSW Aftercare Discharge Planning Group Note   12/31/2013 1:13 PM    Participation Quality:  Appropraite  Mood/Affect:  Appropriate  Depression Rating:  2  Anxiety Rating:  4  Thoughts of Suicide:  No  Will you contract for safety?   NA  Current AVH:  No  Plan for Discharge/Comments:  Patient attended discharge planning group and actively participated in group.  He reports doing well today and ready to discharge.  He will follow up with [facility]. CSW provided all participants with daily workbook.   Transportation Means: Patient has transportation.   Supports:  Patient has a support system.   Lene Mckay, Joesph July

## 2013-12-31 NOTE — Progress Notes (Signed)
[facility] Adult Case Management Discharge Plan :  Will you be returning to the same living situation after discharge: Yes,  Patient is returning home with family. At discharge, do you have transportation home?:Yes,  Patient is arranging transportation home. Do you have the ability to pay for your medications:No.  Patient is able to obtain medications.  Release of information consent forms completed and in the chart;  Patient's signature needed at discharge.  Patient to Follow up at: Follow-up Information   Follow up with [facility] On 01/02/2014. (Wednesday, January 02, 2014 at 2:30 PM)    Contact information:   [address] [place], [place]   [address]  [phone]      Patient denies SI/HI:   Patient no longer endorsing SI/HI or other thoughts of self harm.   Safety Planning and Suicide Prevention discussed: .Reviewed with all patients during discharge planning group   Douglas Casey, Joesph July 12/31/2013, 1:13 PM

## 2013-12-31 NOTE — Progress Notes (Signed)
Patient ID: Douglas Casey, male   DOB: 26-Nov-1980, 33 y.o.   MRN: [id number] PER STATE REGULATIONS 482.30  THIS CHART WAS REVIEWED FOR MEDICAL NECESSITY WITH RESPECT TO THE PATIENT'S ADMISSION/ DURATION OF STAY.  NEXT REVIEW DATE: 01/02/2014  Willa Rough, RN, BSN CASE MANAGER

## 2014-01-03 NOTE — Progress Notes (Signed)
Patient Discharge Instructions:  After Visit Summary (AVS):   Faxed to:  01/03/14 Psychiatric Admission Assessment Note:   Faxed to:  01/03/14 Suicide Risk Assessment - Discharge Assessment:   Faxed to:  01/03/14 Faxed/Sent to the Next Level Care provider:  01/03/14 Faxed to [facility] @ [phone]  Jerelene Redden, 01/03/2014, 4:06 PM

## 2014-01-18 NOTE — Discharge Summary (Signed)
Addendum  Patient seen and examined, chart and data base reviewed.  I agree with the above assessment and plan.  For full details please see Mrs. Algis Downs PA note.  See original DC note from 6/29   Clint Lipps, MD [facility] Pager: [phone] 01/18/2014, 8:42 AM
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
## 2014-01-28 ENCOUNTER — Other Ambulatory Visit (HOSPITAL_COMMUNITY): Payer: Self-pay | Admitting: Psychiatry

## 2014-01-29 ENCOUNTER — Other Ambulatory Visit (HOSPITAL_COMMUNITY): Payer: Self-pay | Admitting: Psychiatry

## 2014-02-07 ENCOUNTER — Other Ambulatory Visit (HOSPITAL_COMMUNITY): Payer: Self-pay | Admitting: Psychiatry

## 2014-03-23 ENCOUNTER — Emergency Department (HOSPITAL_COMMUNITY): Payer: Medicare Other

## 2014-03-23 ENCOUNTER — Encounter (HOSPITAL_COMMUNITY): Payer: Self-pay | Admitting: Emergency Medicine

## 2014-03-23 ENCOUNTER — Emergency Department (HOSPITAL_COMMUNITY)
Admission: EM | Admit: 2014-03-23 | Discharge: 2014-03-24 | Disposition: A | Discharge status: Home / Self Care | Payer: Medicare Other | Attending: Emergency Medicine | Admitting: Emergency Medicine

## 2014-03-23 DIAGNOSIS — I10 Essential (primary) hypertension: Secondary | ICD-10-CM | POA: Insufficient documentation

## 2014-03-23 DIAGNOSIS — Z9889 Other specified postprocedural states: Secondary | ICD-10-CM | POA: Insufficient documentation

## 2014-03-23 DIAGNOSIS — Y9389 Activity, other specified: Secondary | ICD-10-CM | POA: Insufficient documentation

## 2014-03-23 DIAGNOSIS — S0990XA Unspecified injury of head, initial encounter: Secondary | ICD-10-CM | POA: Diagnosis not present

## 2014-03-23 DIAGNOSIS — S79919A Unspecified injury of unspecified hip, initial encounter: Secondary | ICD-10-CM | POA: Diagnosis not present

## 2014-03-23 DIAGNOSIS — W1809XA Striking against other object with subsequent fall, initial encounter: Secondary | ICD-10-CM | POA: Insufficient documentation

## 2014-03-23 DIAGNOSIS — S79929A Unspecified injury of unspecified thigh, initial encounter: Secondary | ICD-10-CM | POA: Diagnosis present

## 2014-03-23 DIAGNOSIS — Z79899 Other long term (current) drug therapy: Secondary | ICD-10-CM | POA: Insufficient documentation

## 2014-03-23 DIAGNOSIS — J45909 Unspecified asthma, uncomplicated: Secondary | ICD-10-CM | POA: Insufficient documentation

## 2014-03-23 DIAGNOSIS — Y9289 Other specified places as the place of occurrence of the external cause: Secondary | ICD-10-CM | POA: Diagnosis not present

## 2014-03-23 DIAGNOSIS — F17200 Nicotine dependence, unspecified, uncomplicated: Secondary | ICD-10-CM | POA: Insufficient documentation

## 2014-03-23 DIAGNOSIS — M25551 Pain in right hip: Secondary | ICD-10-CM

## 2014-03-23 MED ORDER — OXYCODONE-ACETAMINOPHEN 5-325 MG PO TABS
2.0000 | ORAL_TABLET | Freq: Once | ORAL | Status: AC
  Administered 2014-03-23: 2 via ORAL
  Filled 2014-03-23: qty 2
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
## 2014-03-23 NOTE — ED Provider Notes (Signed)
CSN: [id number]     Arrival date & time 03/23/14  2209 History   First MD Initiated Contact with Patient 03/23/14 2244     Chief Complaint  Patient presents with  . Fall  . Hip Pain     (Consider location/radiation/quality/duration/timing/severity/associated sxs/prior Treatment) HPI Comments: Patient presents today with a chief complaint of right hip pain and headache.  He reports that this morning approximately 15 hours prior to arrival he had a fall and hit his head on the counter top.  He reports that he developed a spasm of the right leg and his right leg gave out causing him to fall.  He reports that he did lose consciousness when he fell.  He estimates that he loss consciousness for a few minutes.  He denies any dizziness or lightheadedness prior to the fall.  He reports that he has had a headache and nausea since the fall.  He also feels that he has had more difficulty focusing.  He denies vision changes.  Denies vomiting.  He is currently not on any anticoagulants.  He also reports that he has had worsening pain of his right hip since the fall.  He reports that he has had previous surgery on that hip and has a plate and screw fixation of the hip.  He is followed by Dr. Daphine Deutscher at [facility] for hip pain.  He reports that he has been diagnosed with Avascular Necrosis of the hip.  He reports baseline pain of the hip, but the pain worsened after the fall.  He reports significant pain with ambulation, but states that he has been able to ambulate.  He denies any new numbness or tingling of the lower extremity.  He denies any new weakness.  He has taken Norco for the pain, but does not feel that it helps.  The history is provided by the patient.    Past Medical History  Diagnosis Date  . Hypertension   . Asthma   . PONV (postoperative nausea and vomiting)    Past Surgical History  Procedure Laterality Date  . Hip surgery    . Ankle fracture surgery    . Hip pinning Right May 2001  .  Ankle closed reduction Right July 2011   History reviewed. No pertinent family history. History  Substance Use Topics  . Smoking status: Current Every Day Smoker -- 1.00 packs/day for 20 years    Types: Cigarettes  . Smokeless tobacco: Never Used     Comment: PATIENT DENIES  . Alcohol Use: Yes     Comment: " OCCASIONAL " 2-3 beers a month    Review of Systems  All other systems reviewed and are negative.     Allergies  Bee venom and Shellfish allergy  Home Medications   Prior to Admission medications   Medication Sig Start Date End Date Taking? Authorizing Provider  albuterol (PROVENTIL HFA;VENTOLIN HFA) 108 (90 BASE) MCG/ACT inhaler Inhale 2 puffs into the lungs every 6 (six) hours as needed for wheezing or shortness of breath.   Yes Historical Provider, MD  ALPRAZolam Prudy Feeler) 1 MG tablet Take 1 tablet (1 mg total) by mouth 2 (two) times daily. 12/31/13  Yes Beau Fanny, FNP  DULoxetine (CYMBALTA) 60 MG capsule Take 1 capsule (60 mg total) by mouth daily. 12/31/13  Yes Beau Fanny, FNP  HYDROcodone-acetaminophen (NORCO) 10-325 MG per tablet Take 1 tablet by mouth every 6 (six) hours as needed.   Yes Historical Provider, MD  QUEtiapine (SEROQUEL)  50 MG tablet Take 1 tablet (50 mg total) by mouth at bedtime. 12/31/13  Yes Everardo All Withrow, FNP   BP 118/75  Pulse 73  Temp(Src) 97.5 F (36.4 C) (Oral)  Resp 19  Ht 5\' 10"  (1.778 m)  Wt 172 lb (78.019 kg)  BMI 24.68 kg/m2  SpO2 100% Physical Exam  Nursing note and vitals reviewed. Constitutional: He appears well-developed and well-nourished.  HENT:  Head: Normocephalic and atraumatic.  Mouth/Throat: Oropharynx is clear and moist.  Eyes: EOM are normal. Pupils are equal, round, and reactive to light.  Neck: Normal range of motion. Neck supple.  Cardiovascular: Normal rate, regular rhythm and normal heart sounds.   Pulses:      Dorsalis pedis pulses are 1+ on the right side, and 2+ on the left side.  Pulmonary/Chest: Effort  normal and breath sounds normal.  Musculoskeletal:       Right hip: He exhibits tenderness and bony tenderness. He exhibits no swelling and no deformity.       Right knee: He exhibits normal range of motion, no swelling and no bony tenderness. No tenderness found.       Right ankle: He exhibits normal range of motion. No tenderness.       Cervical back: He exhibits normal range of motion, no tenderness, no bony tenderness, no swelling, no edema and no deformity.       Thoracic back: He exhibits normal range of motion, no tenderness, no bony tenderness, no swelling, no edema and no deformity.       Lumbar back: He exhibits normal range of motion, no tenderness, no bony tenderness, no swelling, no edema and no deformity.  Pain with ROM of the right hip   Neurological: He is alert. He has normal strength. No cranial nerve deficit or sensory deficit. Coordination and gait normal.  Distal sensation of both feet intact  Skin: Skin is warm and dry.  Psychiatric: He has a normal mood and affect.    ED Course  Procedures (including critical care time) Labs Review Labs Reviewed - No data to display  Imaging Review Dg Hip Complete Right  03/23/2014   CLINICAL DATA:  Larey Seat this morning with right hip pain.  EXAM: RIGHT HIP - COMPLETE 2+ VIEW  COMPARISON:  09/10/2012  FINDINGS: Postoperative changes in the right acetabulum with plate and screw fixation and old fracture deformities. Loss of superior hip joint space with bone-on-bone impaction. Deformity of the right humeral head with sclerosis and lucency consistent with severe degenerative change. No evidence of acute fracture or dislocation in the pelvis or right hip.  IMPRESSION: No acute bony abnormalities. Postoperative changes and deformity of the right acetabulum with severe degenerative changes in the right hip.   Electronically Signed   By: Burman Nieves M.D.   On: 03/23/2014 23:42   Ct Head Wo Contrast  03/23/2014   CLINICAL DATA:  Confusion  after a fall in the morning.  Headache.  EXAM: CT HEAD WITHOUT CONTRAST  TECHNIQUE: Contiguous axial images were obtained from the base of the skull through the vertex without intravenous contrast.  COMPARISON:  None.  FINDINGS: Ventricles and sulci appear symmetrical. No mass effect or midline shift. No abnormal extra-axial fluid collections. Gray-white matter junctions are distinct. Basal cisterns are not effaced. No evidence of acute intracranial hemorrhage. No depressed skull fractures. Opacification and air-fluid level in the right maxillary antrum. No displaced fractures are identified and this may represent inflammatory changes 2 sinusitis.  IMPRESSION: No acute  intracranial abnormalities. Air-fluid level in the right maxillary antrum.   Electronically Signed   By: Burman Nieves M.D.   On: 03/23/2014 23:52     EKG Interpretation None      MDM   Final diagnoses:  None   Patient presenting with headache and right hip pain that has been present since a fall that occurred this morning.   Patient reports LOC when he fell.  Patient with normal neurological exam.  Head CT is negative.  Patient also with hip pain.  No acute findings on hip xray.  Patient able to ambulate.  Patient neurovascularly intact.  Patient instructed to follow up with his Orthopedist.  Stable for discharge.  Return precautions given.     Santiago Glad, PA-C 03/24/14 0110  Santiago Glad, PA-C 03/24/14 0111

## 2014-03-23 NOTE — ED Notes (Signed)
Patient is alert with some intermittent confusion.  Patient states that he fell this morning and hit his head and was temporary knocked out.  Patient also complains of pain in the right hip and right leg with a history of vascular necrosis.

## 2014-03-24 DIAGNOSIS — S79919A Unspecified injury of unspecified hip, initial encounter: Secondary | ICD-10-CM | POA: Diagnosis not present

## 2014-03-24 MED ORDER — HYDROMORPHONE HCL 1 MG/ML IJ SOLN
1.0000 mg | Freq: Once | INTRAMUSCULAR | Status: AC
  Administered 2014-03-24: 1 mg via INTRAMUSCULAR
  Filled 2014-03-24: qty 1

## 2014-03-24 MED ORDER — OXYCODONE-ACETAMINOPHEN 5-325 MG PO TABS: 1.0000 | ORAL_TABLET | Freq: Four times a day (QID) | ORAL | Status: DC | PRN

## 2014-03-24 NOTE — ED Notes (Signed)
Patient is alert and oriented x3.  He was given DC instructions and follow up visit instructions.  Patient gave verbal understanding.  He was DC ambulatory under his own power to home.  V/S stable.  He was not showing any signs of distress on DC 

## 2014-03-28 NOTE — ED Provider Notes (Signed)
Medical screening examination/treatment/procedure(s) were performed by non-physician practitioner and as supervising physician I was immediately available for consultation/collaboration.   EKG Interpretation None       Dorrene Bently, MD 03/28/14 1343 

## 2014-07-09 DIAGNOSIS — G8929 Other chronic pain: Secondary | ICD-10-CM | POA: Diagnosis not present

## 2014-07-09 DIAGNOSIS — R531 Weakness: Secondary | ICD-10-CM | POA: Diagnosis not present

## 2014-07-09 DIAGNOSIS — Z79810 Long term (current) use of selective estrogen receptor modulators (SERMs): Secondary | ICD-10-CM | POA: Diagnosis not present

## 2014-07-09 DIAGNOSIS — F419 Anxiety disorder, unspecified: Secondary | ICD-10-CM | POA: Diagnosis not present

## 2014-07-09 DIAGNOSIS — E780 Pure hypercholesterolemia: Secondary | ICD-10-CM | POA: Diagnosis not present

## 2014-07-09 DIAGNOSIS — I10 Essential (primary) hypertension: Secondary | ICD-10-CM | POA: Diagnosis not present

## 2014-08-08 DIAGNOSIS — I10 Essential (primary) hypertension: Secondary | ICD-10-CM | POA: Diagnosis not present

## 2014-08-08 DIAGNOSIS — M79609 Pain in unspecified limb: Secondary | ICD-10-CM | POA: Diagnosis not present

## 2014-08-08 DIAGNOSIS — R5382 Chronic fatigue, unspecified: Secondary | ICD-10-CM | POA: Diagnosis not present

## 2014-09-05 DIAGNOSIS — Z79891 Long term (current) use of opiate analgesic: Secondary | ICD-10-CM | POA: Diagnosis not present

## 2014-09-05 DIAGNOSIS — G4700 Insomnia, unspecified: Secondary | ICD-10-CM | POA: Diagnosis not present

## 2014-09-05 DIAGNOSIS — G894 Chronic pain syndrome: Secondary | ICD-10-CM | POA: Diagnosis not present

## 2014-09-05 DIAGNOSIS — Z5181 Encounter for therapeutic drug level monitoring: Secondary | ICD-10-CM | POA: Diagnosis not present

## 2014-09-05 DIAGNOSIS — M79609 Pain in unspecified limb: Secondary | ICD-10-CM | POA: Diagnosis not present

## 2014-09-12 DIAGNOSIS — Z79891 Long term (current) use of opiate analgesic: Secondary | ICD-10-CM | POA: Diagnosis not present

## 2014-09-12 DIAGNOSIS — R5382 Chronic fatigue, unspecified: Secondary | ICD-10-CM | POA: Diagnosis not present

## 2014-09-12 DIAGNOSIS — M129 Arthropathy, unspecified: Secondary | ICD-10-CM | POA: Diagnosis not present

## 2014-09-12 DIAGNOSIS — Z5181 Encounter for therapeutic drug level monitoring: Secondary | ICD-10-CM | POA: Diagnosis not present

## 2014-09-12 DIAGNOSIS — G4700 Insomnia, unspecified: Secondary | ICD-10-CM | POA: Diagnosis not present

## 2014-10-14 DIAGNOSIS — M129 Arthropathy, unspecified: Secondary | ICD-10-CM | POA: Diagnosis not present

## 2014-10-14 DIAGNOSIS — G8929 Other chronic pain: Secondary | ICD-10-CM | POA: Diagnosis not present

## 2014-10-14 DIAGNOSIS — F419 Anxiety disorder, unspecified: Secondary | ICD-10-CM | POA: Diagnosis not present

## 2014-10-14 DIAGNOSIS — R51 Headache: Secondary | ICD-10-CM | POA: Diagnosis not present

## 2014-11-11 DIAGNOSIS — G894 Chronic pain syndrome: Secondary | ICD-10-CM | POA: Diagnosis not present

## 2014-11-11 DIAGNOSIS — M129 Arthropathy, unspecified: Secondary | ICD-10-CM | POA: Diagnosis not present

## 2014-11-11 DIAGNOSIS — G4700 Insomnia, unspecified: Secondary | ICD-10-CM | POA: Diagnosis not present

## 2014-11-11 DIAGNOSIS — R5382 Chronic fatigue, unspecified: Secondary | ICD-10-CM | POA: Diagnosis not present

## 2014-12-10 DIAGNOSIS — M6240 Contracture of muscle, unspecified site: Secondary | ICD-10-CM | POA: Diagnosis not present

## 2014-12-10 DIAGNOSIS — F419 Anxiety disorder, unspecified: Secondary | ICD-10-CM | POA: Diagnosis not present

## 2014-12-10 DIAGNOSIS — G894 Chronic pain syndrome: Secondary | ICD-10-CM | POA: Diagnosis not present

## 2014-12-10 DIAGNOSIS — R51 Headache: Secondary | ICD-10-CM | POA: Diagnosis not present

## 2015-01-07 DIAGNOSIS — G43109 Migraine with aura, not intractable, without status migrainosus: Secondary | ICD-10-CM | POA: Diagnosis not present

## 2015-01-07 DIAGNOSIS — G894 Chronic pain syndrome: Secondary | ICD-10-CM | POA: Diagnosis not present

## 2015-01-07 DIAGNOSIS — G4700 Insomnia, unspecified: Secondary | ICD-10-CM | POA: Diagnosis not present

## 2015-01-07 DIAGNOSIS — I10 Essential (primary) hypertension: Secondary | ICD-10-CM | POA: Diagnosis not present

## 2015-01-09 DIAGNOSIS — M25551 Pain in right hip: Secondary | ICD-10-CM | POA: Diagnosis not present

## 2015-01-09 DIAGNOSIS — S79911A Unspecified injury of right hip, initial encounter: Secondary | ICD-10-CM | POA: Diagnosis not present

## 2015-01-09 DIAGNOSIS — F17210 Nicotine dependence, cigarettes, uncomplicated: Secondary | ICD-10-CM | POA: Diagnosis not present

## 2015-01-09 DIAGNOSIS — M79604 Pain in right leg: Secondary | ICD-10-CM | POA: Diagnosis not present

## 2015-01-09 DIAGNOSIS — J449 Chronic obstructive pulmonary disease, unspecified: Secondary | ICD-10-CM | POA: Diagnosis not present

## 2015-01-09 DIAGNOSIS — G8929 Other chronic pain: Secondary | ICD-10-CM | POA: Diagnosis not present

## 2015-02-05 DIAGNOSIS — F419 Anxiety disorder, unspecified: Secondary | ICD-10-CM | POA: Diagnosis not present

## 2015-02-05 DIAGNOSIS — I10 Essential (primary) hypertension: Secondary | ICD-10-CM | POA: Diagnosis not present

## 2015-02-05 DIAGNOSIS — M25559 Pain in unspecified hip: Secondary | ICD-10-CM | POA: Diagnosis not present

## 2015-02-05 DIAGNOSIS — G894 Chronic pain syndrome: Secondary | ICD-10-CM | POA: Diagnosis not present

## 2015-03-06 DIAGNOSIS — I10 Essential (primary) hypertension: Secondary | ICD-10-CM | POA: Diagnosis not present

## 2015-03-06 DIAGNOSIS — F419 Anxiety disorder, unspecified: Secondary | ICD-10-CM | POA: Diagnosis not present

## 2015-03-06 DIAGNOSIS — G8929 Other chronic pain: Secondary | ICD-10-CM | POA: Diagnosis not present

## 2015-03-06 DIAGNOSIS — G4700 Insomnia, unspecified: Secondary | ICD-10-CM | POA: Diagnosis not present

## 2015-04-07 DIAGNOSIS — G8929 Other chronic pain: Secondary | ICD-10-CM | POA: Diagnosis not present

## 2015-04-07 DIAGNOSIS — F419 Anxiety disorder, unspecified: Secondary | ICD-10-CM | POA: Diagnosis not present

## 2015-04-07 DIAGNOSIS — M25552 Pain in left hip: Secondary | ICD-10-CM | POA: Diagnosis not present

## 2015-04-07 DIAGNOSIS — I10 Essential (primary) hypertension: Secondary | ICD-10-CM | POA: Diagnosis not present

## 2015-05-06 DIAGNOSIS — Z5181 Encounter for therapeutic drug level monitoring: Secondary | ICD-10-CM | POA: Diagnosis not present

## 2015-05-06 DIAGNOSIS — G609 Hereditary and idiopathic neuropathy, unspecified: Secondary | ICD-10-CM | POA: Diagnosis not present

## 2015-05-06 DIAGNOSIS — Z79891 Long term (current) use of opiate analgesic: Secondary | ICD-10-CM | POA: Diagnosis not present

## 2015-05-06 DIAGNOSIS — J209 Acute bronchitis, unspecified: Secondary | ICD-10-CM | POA: Diagnosis not present

## 2015-05-06 DIAGNOSIS — M545 Low back pain: Secondary | ICD-10-CM | POA: Diagnosis not present

## 2015-06-04 DIAGNOSIS — G894 Chronic pain syndrome: Secondary | ICD-10-CM | POA: Diagnosis not present

## 2015-06-04 DIAGNOSIS — F419 Anxiety disorder, unspecified: Secondary | ICD-10-CM | POA: Diagnosis not present

## 2015-06-04 DIAGNOSIS — M545 Low back pain: Secondary | ICD-10-CM | POA: Diagnosis not present

## 2015-06-10 DIAGNOSIS — F29 Unspecified psychosis not due to a substance or known physiological condition: Secondary | ICD-10-CM | POA: Diagnosis not present

## 2015-06-10 DIAGNOSIS — F0391 Unspecified dementia with behavioral disturbance: Secondary | ICD-10-CM | POA: Diagnosis not present

## 2015-09-01 DIAGNOSIS — G894 Chronic pain syndrome: Secondary | ICD-10-CM | POA: Diagnosis not present

## 2015-09-29 DIAGNOSIS — M25571 Pain in right ankle and joints of right foot: Secondary | ICD-10-CM | POA: Diagnosis not present

## 2015-09-29 DIAGNOSIS — M25551 Pain in right hip: Secondary | ICD-10-CM | POA: Diagnosis not present

## 2015-10-06 IMAGING — CT CT HEAD W/O CM
2 series · 17 of 30 positions shown, 20 images · non-contrast
Comparison: None.

CLINICAL DATA: Confusion after a fall in the morning.  Headache.

EXAM:
CT HEAD WITHOUT CONTRAST
TECHNIQUE: Contiguous axial images were obtained from the base of the skull
through the vertex without intravenous contrast.

[Series 2: head w/o · axial · non-contrast · 0.45mm/px · z∈[-141,-26]mm · 9 of 29 slices shown, 12 images]
[im 3/29  brain]
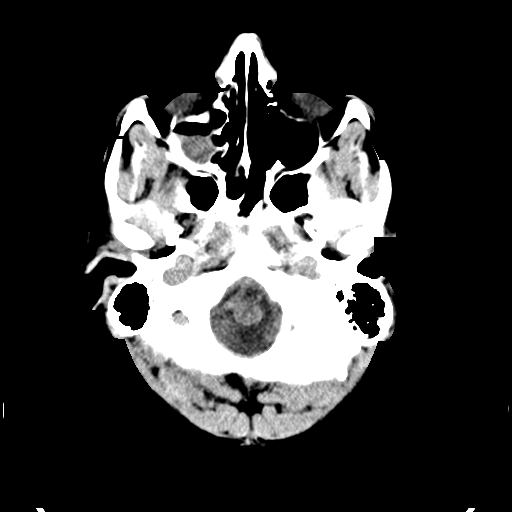
[im 3/29  bone]
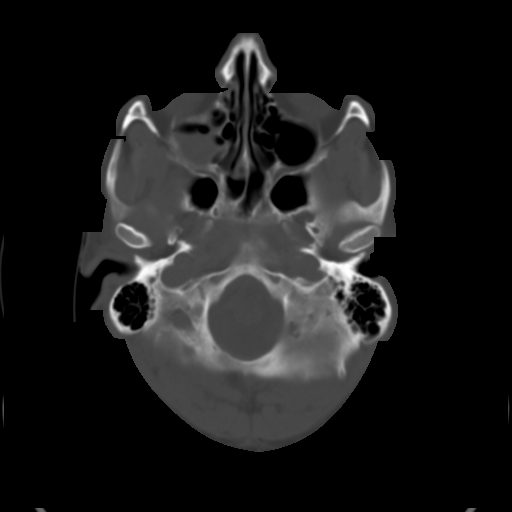
[im 6/29  brain]
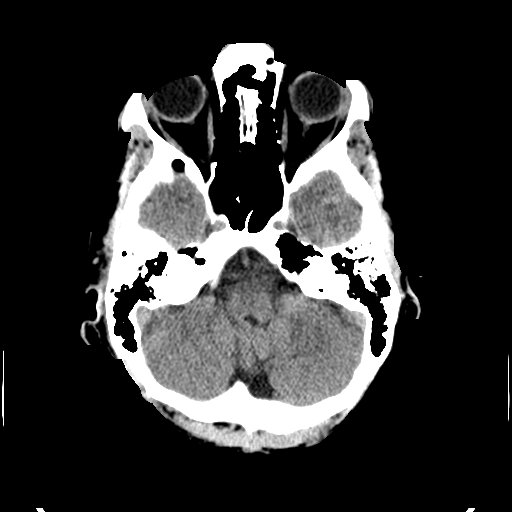
[im 9/29  brain]
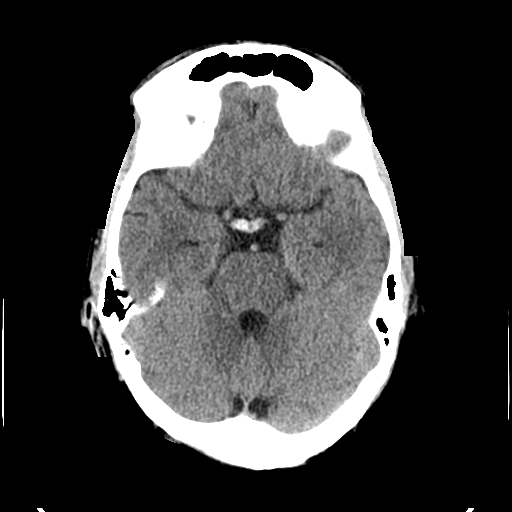
[im 12/29  brain]
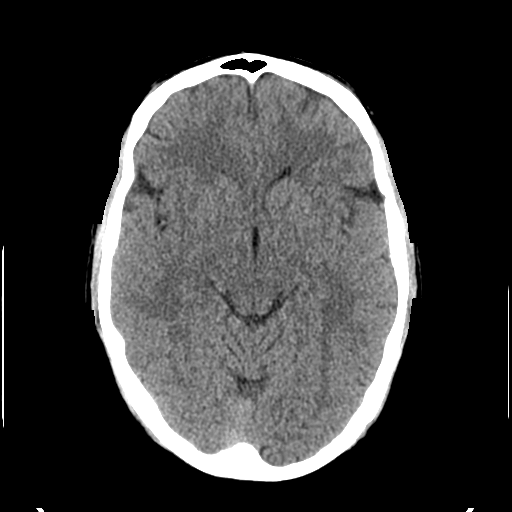
[im 15/29  brain]
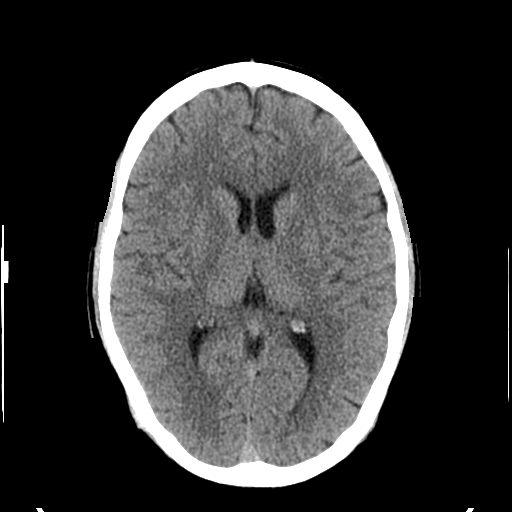
[im 15/29  bone]
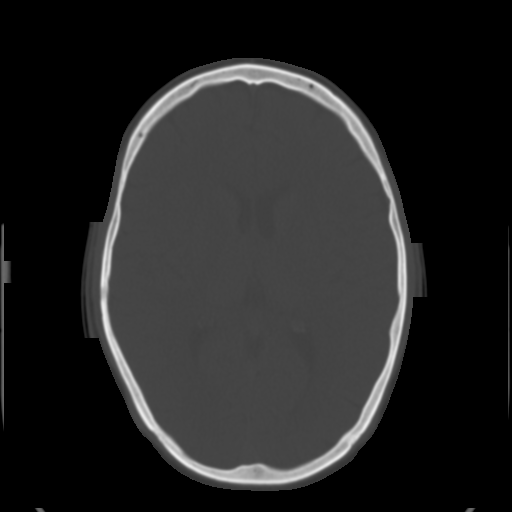
[im 17/29  brain]
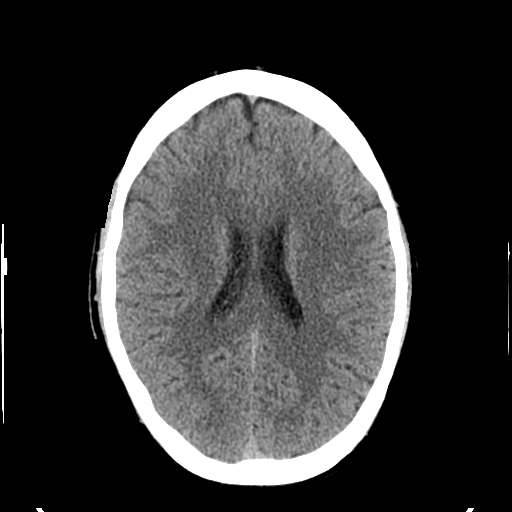
[im 20/29  brain]
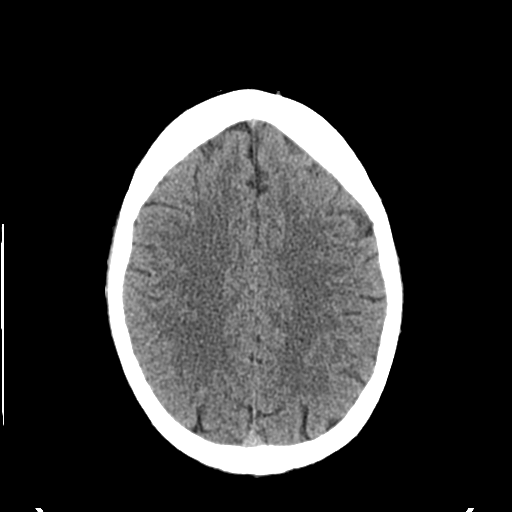
[im 23/29  brain]
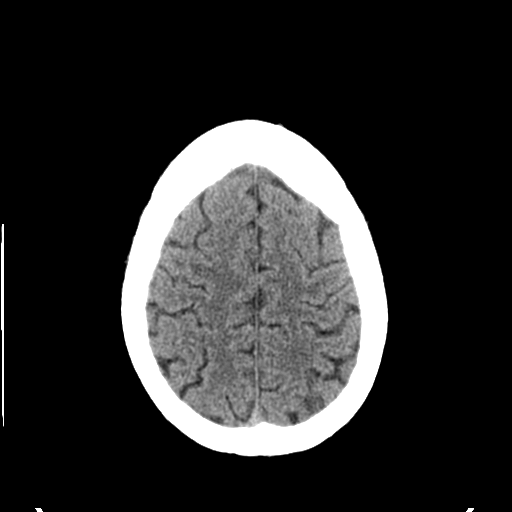
[im 26/29  brain]
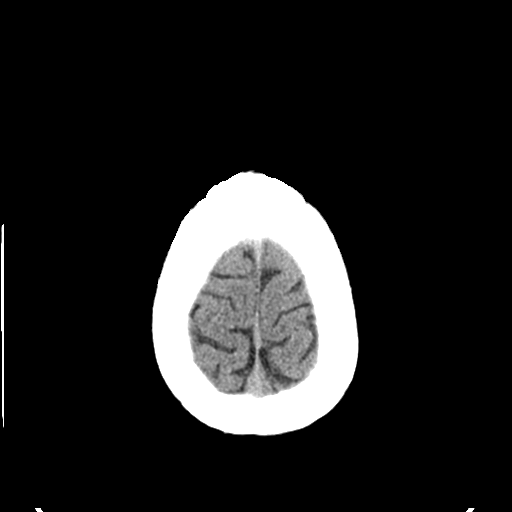
[im 26/29  bone]
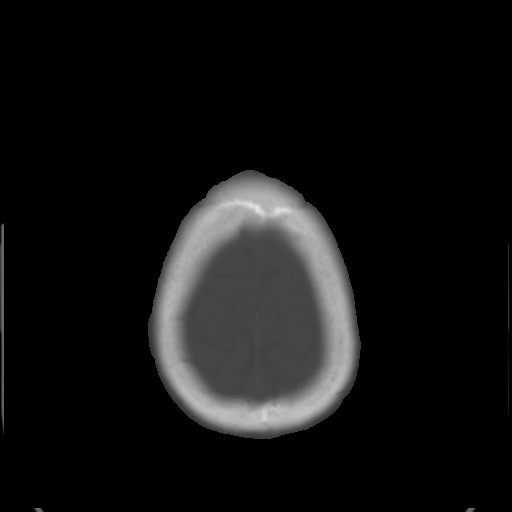

[Series 3: bone windows · axial · 0.45mm/px · z∈[-136,-28]mm · 8 of 48 slices shown]
[im 6/48  bone]
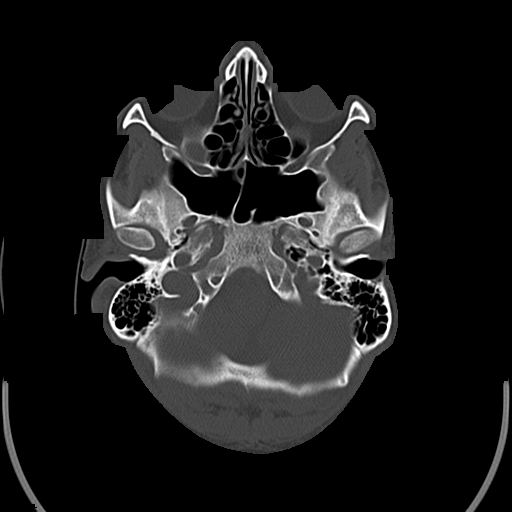
[im 11/48  bone]
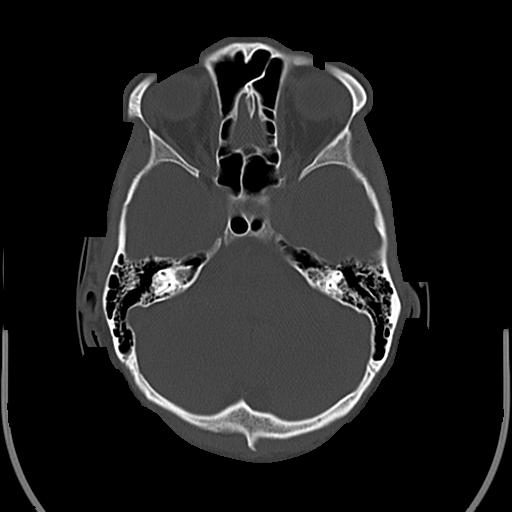
[im 16/48  bone]
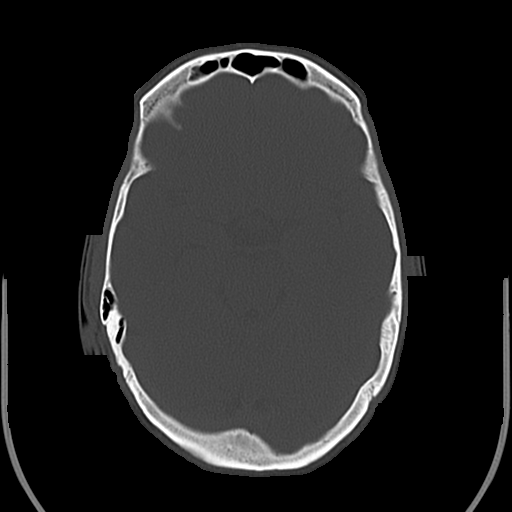
[im 21/48  bone]
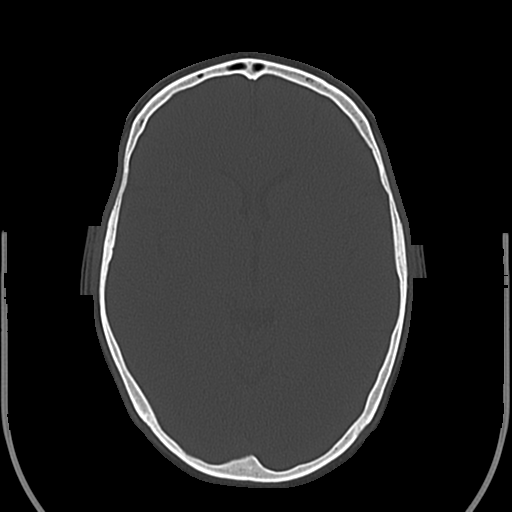
[im 27/48  bone]
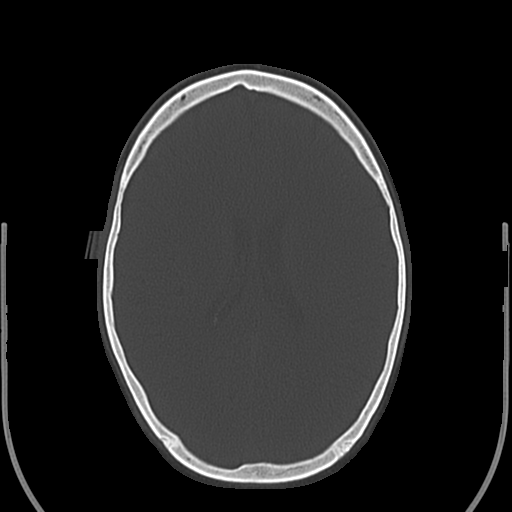
[im 32/48  bone]
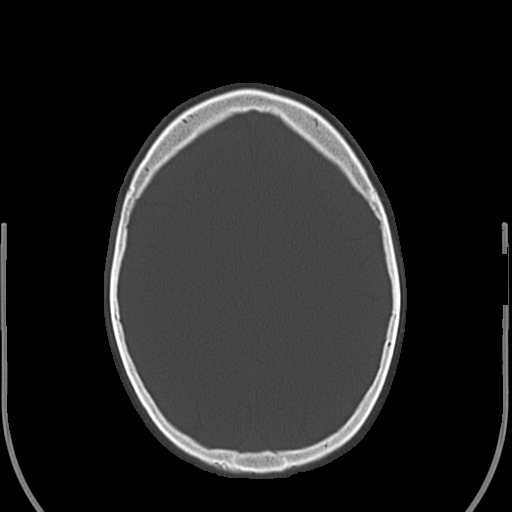
[im 37/48  bone]
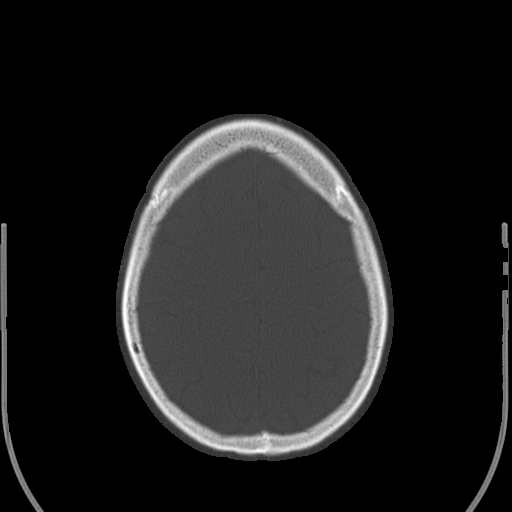
[im 42/48  bone]
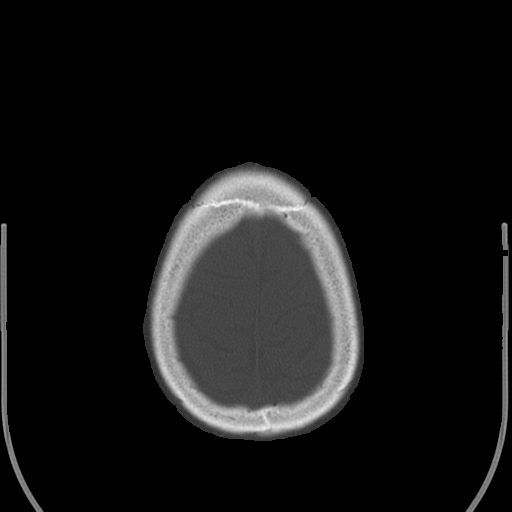

[17 of 30 positions shown; findings below may reference images not displayed]

FINDINGS: Ventricles and sulci appear symmetrical. No mass effect or midline
shift. No abnormal extra-axial fluid collections. Gray-white matter
junctions are distinct. Basal cisterns are not effaced. No evidence
of acute intracranial hemorrhage. No depressed skull fractures.
Opacification and air-fluid level in the right maxillary antrum. No
displaced fractures are identified and this may represent
inflammatory changes 2 sinusitis.
IMPRESSION: No acute intracranial abnormalities. Air-fluid level in the right
maxillary antrum.

## 2015-11-27 DIAGNOSIS — G894 Chronic pain syndrome: Secondary | ICD-10-CM | POA: Diagnosis not present

## 2016-01-01 DIAGNOSIS — G894 Chronic pain syndrome: Secondary | ICD-10-CM | POA: Diagnosis not present

## 2016-01-07 DIAGNOSIS — R079 Chest pain, unspecified: Secondary | ICD-10-CM | POA: Diagnosis not present

## 2016-01-07 DIAGNOSIS — R0602 Shortness of breath: Secondary | ICD-10-CM | POA: Diagnosis not present

## 2016-01-08 ENCOUNTER — Encounter (HOSPITAL_COMMUNITY): Payer: Self-pay | Admitting: *Deleted

## 2016-01-08 ENCOUNTER — Inpatient Hospital Stay (HOSPITAL_COMMUNITY)
Admission: AD | Admit: 2016-01-08 | Discharge: 2016-01-16 | DRG: 885 | Disposition: A | Discharge status: Home / Self Care | Payer: Medicare Other | Source: Intra-hospital | Attending: Psychiatry | Admitting: Psychiatry

## 2016-01-08 DIAGNOSIS — F1120 Opioid dependence, uncomplicated: Secondary | ICD-10-CM | POA: Diagnosis present

## 2016-01-08 DIAGNOSIS — F410 Panic disorder [episodic paroxysmal anxiety] without agoraphobia: Secondary | ICD-10-CM | POA: Diagnosis present

## 2016-01-08 DIAGNOSIS — I10 Essential (primary) hypertension: Secondary | ICD-10-CM | POA: Diagnosis present

## 2016-01-08 DIAGNOSIS — F17210 Nicotine dependence, cigarettes, uncomplicated: Secondary | ICD-10-CM | POA: Diagnosis present

## 2016-01-08 DIAGNOSIS — J45909 Unspecified asthma, uncomplicated: Secondary | ICD-10-CM | POA: Diagnosis present

## 2016-01-08 DIAGNOSIS — Z91013 Allergy to seafood: Secondary | ICD-10-CM | POA: Diagnosis not present

## 2016-01-08 DIAGNOSIS — Z818 Family history of other mental and behavioral disorders: Secondary | ICD-10-CM | POA: Diagnosis not present

## 2016-01-08 DIAGNOSIS — Z91030 Bee allergy status: Secondary | ICD-10-CM | POA: Diagnosis not present

## 2016-01-08 DIAGNOSIS — F13239 Sedative, hypnotic or anxiolytic dependence with withdrawal, unspecified: Secondary | ICD-10-CM | POA: Diagnosis present

## 2016-01-08 DIAGNOSIS — F411 Generalized anxiety disorder: Secondary | ICD-10-CM | POA: Diagnosis present

## 2016-01-08 DIAGNOSIS — F4010 Social phobia, unspecified: Secondary | ICD-10-CM | POA: Diagnosis present

## 2016-01-08 DIAGNOSIS — F1123 Opioid dependence with withdrawal: Secondary | ICD-10-CM | POA: Diagnosis present

## 2016-01-08 DIAGNOSIS — F1320 Sedative, hypnotic or anxiolytic dependence, uncomplicated: Secondary | ICD-10-CM

## 2016-01-08 DIAGNOSIS — G4700 Insomnia, unspecified: Secondary | ICD-10-CM | POA: Diagnosis present

## 2016-01-08 DIAGNOSIS — K0889 Other specified disorders of teeth and supporting structures: Secondary | ICD-10-CM | POA: Diagnosis present

## 2016-01-08 DIAGNOSIS — F332 Major depressive disorder, recurrent severe without psychotic features: Secondary | ICD-10-CM | POA: Diagnosis present

## 2016-01-08 DIAGNOSIS — F339 Major depressive disorder, recurrent, unspecified: Secondary | ICD-10-CM | POA: Diagnosis present

## 2016-01-08 DIAGNOSIS — F1390 Sedative, hypnotic, or anxiolytic use, unspecified, uncomplicated: Secondary | ICD-10-CM | POA: Diagnosis not present

## 2016-01-08 DIAGNOSIS — G8929 Other chronic pain: Secondary | ICD-10-CM | POA: Diagnosis present

## 2016-01-08 MED ORDER — TRAZODONE HCL 50 MG PO TABS: 50.0000 mg | ORAL_TABLET | Freq: Every evening | ORAL | Status: DC | PRN

## 2016-01-08 MED ORDER — ALUM & MAG HYDROXIDE-SIMETH 200-200-20 MG/5ML PO SUSP
30.0000 mL | ORAL | Status: DC | PRN
  Administered 2016-01-08: 30 mL via ORAL
  Filled 2016-01-08: qty 30

## 2016-01-08 MED ORDER — ONDANSETRON 4 MG PO TBDP
4.0000 mg | ORAL_TABLET | Freq: Four times a day (QID) | ORAL | Status: AC | PRN
  Administered 2016-01-08: 4 mg via ORAL
  Filled 2016-01-08: qty 1

## 2016-01-08 MED ORDER — ADULT MULTIVITAMIN W/MINERALS CH
1.0000 | ORAL_TABLET | Freq: Every day | ORAL | Status: DC
  Administered 2016-01-09 – 2016-01-16 (×8): 1 via ORAL
  Filled 2016-01-08 (×11): qty 1

## 2016-01-08 MED ORDER — CHLORDIAZEPOXIDE HCL 25 MG PO CAPS: 25.0000 mg | ORAL_CAPSULE | Freq: Four times a day (QID) | ORAL | Status: AC | PRN

## 2016-01-08 MED ORDER — CHLORDIAZEPOXIDE HCL 25 MG PO CAPS
25.0000 mg | ORAL_CAPSULE | ORAL | Status: AC
  Administered 2016-01-11 (×2): 25 mg via ORAL
  Filled 2016-01-08 (×2): qty 1

## 2016-01-08 MED ORDER — HYDROXYZINE HCL 25 MG PO TABS
25.0000 mg | ORAL_TABLET | Freq: Four times a day (QID) | ORAL | Status: AC | PRN
  Administered 2016-01-08 – 2016-01-13 (×4): 25 mg via ORAL
  Filled 2016-01-08 (×4): qty 1

## 2016-01-08 MED ORDER — CLONIDINE HCL 0.1 MG PO TABS
0.1000 mg | ORAL_TABLET | ORAL | Status: DC
  Administered 2016-01-11 – 2016-01-12 (×3): 0.1 mg via ORAL
  Filled 2016-01-08 (×4): qty 1

## 2016-01-08 MED ORDER — DICYCLOMINE HCL 20 MG PO TABS
20.0000 mg | ORAL_TABLET | Freq: Four times a day (QID) | ORAL | Status: DC | PRN
  Administered 2016-01-11: 20 mg via ORAL
  Filled 2016-01-08: qty 1

## 2016-01-08 MED ORDER — CHLORDIAZEPOXIDE HCL 25 MG PO CAPS
25.0000 mg | ORAL_CAPSULE | Freq: Every day | ORAL | Status: DC
  Filled 2016-01-08: qty 1

## 2016-01-08 MED ORDER — ACETAMINOPHEN 325 MG PO TABS
650.0000 mg | ORAL_TABLET | Freq: Four times a day (QID) | ORAL | Status: DC | PRN
Start: 2016-01-08 — End: 2016-01-16
  Administered 2016-01-08 – 2016-01-10 (×3): 650 mg via ORAL
  Filled 2016-01-08 (×3): qty 2

## 2016-01-08 MED ORDER — METHOCARBAMOL 500 MG PO TABS
500.0000 mg | ORAL_TABLET | Freq: Three times a day (TID) | ORAL | Status: AC | PRN
  Administered 2016-01-08 – 2016-01-13 (×6): 500 mg via ORAL
  Filled 2016-01-08 (×6): qty 1

## 2016-01-08 MED ORDER — CLONIDINE HCL 0.1 MG PO TABS
0.1000 mg | ORAL_TABLET | Freq: Every day | ORAL | Status: DC
  Filled 2016-01-08 (×2): qty 1

## 2016-01-08 MED ORDER — NAPROXEN 500 MG PO TABS
500.0000 mg | ORAL_TABLET | Freq: Two times a day (BID) | ORAL | Status: AC | PRN
  Administered 2016-01-08 – 2016-01-13 (×7): 500 mg via ORAL
  Filled 2016-01-08 (×8): qty 1

## 2016-01-08 MED ORDER — NICOTINE 21 MG/24HR TD PT24
21.0000 mg | MEDICATED_PATCH | Freq: Every day | TRANSDERMAL | Status: DC
  Administered 2016-01-08 – 2016-01-16 (×9): 21 mg via TRANSDERMAL
  Filled 2016-01-08 (×12): qty 1

## 2016-01-08 MED ORDER — THIAMINE HCL 100 MG/ML IJ SOLN
100.0000 mg | Freq: Once | INTRAMUSCULAR | Status: AC
  Administered 2016-01-08: 100 mg via INTRAMUSCULAR
  Filled 2016-01-08: qty 2

## 2016-01-08 MED ORDER — HYDROXYZINE HCL 25 MG PO TABS
25.0000 mg | ORAL_TABLET | Freq: Four times a day (QID) | ORAL | Status: DC | PRN
  Administered 2016-01-08: 25 mg via ORAL
  Filled 2016-01-08: qty 1

## 2016-01-08 MED ORDER — CHLORDIAZEPOXIDE HCL 25 MG PO CAPS
25.0000 mg | ORAL_CAPSULE | Freq: Four times a day (QID) | ORAL | Status: AC
  Administered 2016-01-08 – 2016-01-09 (×6): 25 mg via ORAL
  Filled 2016-01-08 (×6): qty 1

## 2016-01-08 MED ORDER — GABAPENTIN 300 MG PO CAPS
300.0000 mg | ORAL_CAPSULE | Freq: Three times a day (TID) | ORAL | Status: DC
  Administered 2016-01-08 – 2016-01-09 (×4): 300 mg via ORAL
  Filled 2016-01-08 (×12): qty 1

## 2016-01-08 MED ORDER — LOPERAMIDE HCL 2 MG PO CAPS
2.0000 mg | ORAL_CAPSULE | ORAL | Status: DC | PRN
  Administered 2016-01-11: 2 mg via ORAL
  Filled 2016-01-08: qty 1

## 2016-01-08 MED ORDER — MAGNESIUM HYDROXIDE 400 MG/5ML PO SUSP: 30.0000 mL | Freq: Every day | ORAL | Status: DC | PRN

## 2016-01-08 MED ORDER — CHLORDIAZEPOXIDE HCL 25 MG PO CAPS
25.0000 mg | ORAL_CAPSULE | Freq: Three times a day (TID) | ORAL | Status: AC
  Administered 2016-01-10 (×3): 25 mg via ORAL
  Filled 2016-01-08 (×3): qty 1

## 2016-01-08 MED ORDER — VITAMIN B-1 100 MG PO TABS
100.0000 mg | ORAL_TABLET | Freq: Every day | ORAL | Status: DC
  Administered 2016-01-09 – 2016-01-16 (×8): 100 mg via ORAL
  Filled 2016-01-08 (×10): qty 1

## 2016-01-08 MED ORDER — CLONIDINE HCL 0.1 MG PO TABS
0.1000 mg | ORAL_TABLET | Freq: Four times a day (QID) | ORAL | Status: AC
  Administered 2016-01-08 – 2016-01-10 (×10): 0.1 mg via ORAL
  Filled 2016-01-08 (×14): qty 1
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
## 2016-01-08 NOTE — Tx Team (Signed)
Initial Interdisciplinary Treatment Plan   PATIENT STRESSORS: Medication change or noncompliance Substance abuse   PATIENT STRENGTHS: Ability for insight Average or above average intelligence Communication skills General fund of knowledge Physical Health   PROBLEM LIST: Problem List/Patient Goals Date to be addressed Date deferred Reason deferred Estimated date of resolution  Substance abuse 01/08/16     anxiety 01/08/16     depression 01/08/16                                          DISCHARGE CRITERIA:  Ability to meet basic life and health needs Adequate post-discharge living arrangements Improved stabilization in mood, thinking, and/or behavior Medical problems require only outpatient monitoring Motivation to continue treatment in a less acute level of care Need for constant or close observation no longer present Reduction of life-threatening or endangering symptoms to within safe limits Safe-care adequate arrangements made Verbal commitment to aftercare and medication compliance Withdrawal symptoms are absent or subacute and managed without 24-hour nursing intervention  PRELIMINARY DISCHARGE PLAN: Attend aftercare/continuing care group Outpatient therapy  PATIENT/FAMIILY INVOLVEMENT: This treatment plan has been presented to and reviewed with the patient, Douglas Casey, and/or family member, .  The patient and family have been given the opportunity to ask questions and make suggestions.  Beatrix Shipper 01/08/2016, 6:56 PM

## 2016-01-08 NOTE — Progress Notes (Signed)
Patient ID: Douglas Casey, male   DOB: 1980-07-20, 35 y.o.   MRN: [id number] PER STATE REGULATIONS 482.30  THIS CHART WAS REVIEWED FOR MEDICAL NECESSITY WITH RESPECT TO THE PATIENT'S ADMISSION/DURATION OF STAY.  NEXT REVIEW DATE:01/12/16  Loura Halt, RN, BSN CASE MANAGER

## 2016-01-08 NOTE — Progress Notes (Signed)
Patient did attend the evening karaoke group. Pt was engaged and supportive but did not participate by singing a song.    

## 2016-01-08 NOTE — Progress Notes (Signed)
Patient ID: Douglas Casey, male   DOB: 11/06/1980, 35 y.o.   MRN: [id number]   Report accepted from admitting nurse, Vanessa Kick, RN. Pt currently presents with a flat affect and depresed behavior. Pt reports that his depression stems from the immobility of his R leg and his past surgeries. Pt in reports pain 6/10 in his hip and leg. Pt supported emotionally and encouraged to express concerns and questions. Pt's safety ensured with 15 minute and environmental checks. Pt currently denies SI/HI and A/V hallucinations. Pt verbally agrees to seek staff if SI/HI or A/VH occurs and to consult with staff before acting on any harmful thoughts. Will continue POC.
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
## 2016-01-08 NOTE — [facility] Group Notes (Signed)
[facility] LCSW Group Therapy  01/08/2016 2:29 PM  Type of Therapy:  Group Therapy  Participation Level:  Did Not Attend-pt new to unit. Invited. Chose to rest in bed.   Summary of Progress/Problems: Today's Topic: Overcoming Obstacles. Patients identified one short term goal and potential obstacles in reaching this goal. Patients processed barriers involved in overcoming these obstacles. Patients identified steps necessary for overcoming these obstacles and explored motivation (internal and external) for facing these difficulties head on.   Smart, Douglas Morency LCSW 01/08/2016, 2:29 PM
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
## 2016-01-09 LAB — CBC WITH DIFFERENTIAL/PLATELET
BASOS ABS: 0 10*3/uL (ref 0.0–0.1)
BASOS PCT: 0 %
EOS ABS: 0.4 10*3/uL (ref 0.0–0.7)
Eosinophils Relative: 4 %
HEMATOCRIT: 37.5 % — AB (ref 39.0–52.0)
HEMOGLOBIN: 12.6 g/dL — AB (ref 13.0–17.0)
Lymphocytes Relative: 31 %
Lymphs Abs: 3 10*3/uL (ref 0.7–4.0)
MCH: 29.6 pg (ref 26.0–34.0)
MCHC: 33.6 g/dL (ref 30.0–36.0)
MCV: 88 fL (ref 78.0–100.0)
MONOS PCT: 9 %
Monocytes Absolute: 0.9 10*3/uL (ref 0.1–1.0)
NEUTROS ABS: 5.6 10*3/uL (ref 1.7–7.7)
NEUTROS PCT: 56 %
Platelets: 233 10*3/uL (ref 150–400)
RBC: 4.26 MIL/uL (ref 4.22–5.81)
RDW: 12.5 % (ref 11.5–15.5)
WBC: 9.9 10*3/uL (ref 4.0–10.5)

## 2016-01-09 LAB — BASIC METABOLIC PANEL
Anion gap: 5 (ref 5–15)
BUN: 10 mg/dL (ref 6–20)
CALCIUM: 9.1 mg/dL (ref 8.9–10.3)
CHLORIDE: 108 mmol/L (ref 101–111)
CO2: 27 mmol/L (ref 22–32)
CREATININE: 0.92 mg/dL (ref 0.61–1.24)
GFR calc non Af Amer: >60 mL/min (ref >60)
Glucose, Bld: 105 mg/dL — ABNORMAL HIGH (ref 65–99)
Potassium: 3.7 mmol/L (ref 3.5–5.1)
SODIUM: 140 mmol/L (ref 135–145)

## 2016-01-09 MED ORDER — TRAZODONE HCL 100 MG PO TABS
100.0000 mg | ORAL_TABLET | Freq: Every evening | ORAL | Status: DC | PRN
  Filled 2016-01-09: qty 1

## 2016-01-09 MED ORDER — KETOROLAC TROMETHAMINE 10 MG PO TABS
10.0000 mg | ORAL_TABLET | Freq: Once | ORAL | Status: AC
  Administered 2016-01-09: 10 mg via ORAL
  Filled 2016-01-09 (×2): qty 1

## 2016-01-09 MED ORDER — BENZOCAINE 10 % MT GEL: Freq: Four times a day (QID) | OROMUCOSAL | Status: DC | PRN

## 2016-01-09 MED ORDER — ALBUTEROL SULFATE HFA 108 (90 BASE) MCG/ACT IN AERS
2.0000 | INHALATION_SPRAY | Freq: Four times a day (QID) | RESPIRATORY_TRACT | Status: DC | PRN
  Administered 2016-01-09 – 2016-01-16 (×6): 2 via RESPIRATORY_TRACT
  Filled 2016-01-09: qty 6.7

## 2016-01-09 MED ORDER — GABAPENTIN 400 MG PO CAPS
400.0000 mg | ORAL_CAPSULE | Freq: Three times a day (TID) | ORAL | Status: DC
  Administered 2016-01-09 – 2016-01-10 (×4): 400 mg via ORAL
  Filled 2016-01-09 (×12): qty 1

## 2016-01-09 NOTE — Progress Notes (Signed)
Recreation Therapy Notes  Date: 07.14.2017 Time: 9:30am Location: 300 [address] Group Room   Group Topic: Stress Management  Goal Area(s) Addresses:  Patient will actively participate in stress management techniques presented during session.   Behavioral Response: Engaged, Attentive  Intervention: Stress management techniques  Activity : Deep Breathing and Progressive Body Relaxation. LRT provided education, instruction and demonstration on practice of Deep Breathing and Progressive Body Relaxation. Patient was asked to participate in technique introduced during session.   Education:  Stress Management, Discharge Planning.   Education Outcome: Acknowledges education  Clinical Observations/Feedback: Patient actively engaged in technique introduced, expressed no concerns and demonstrated ability to practice independently post d/c.   Zaidin Blyden L Gunther Zawadzki, LRT/CTRS        Dresden Ament L 01/09/2016 6:08 PM 

## 2016-01-09 NOTE — Progress Notes (Signed)
DAR NOTE: Patient presents with anxious affect and depressed mood. Pt complained of left hip pain, denies auditory and visual hallucinations.  Rates depression at 6, hopelessness at 4, and anxiety at 7.  Maintained on routine safety checks.  Medications given as prescribed.  Support and encouragement offered as needed.  Attended group and participated.  States goal for today is " just to feel a little comfort and began the rehabilitation process."  Patient observed socializing with peers in the dayroom. Will continue to monitor.

## 2016-01-09 NOTE — [facility] Suicide Risk Assessment (Signed)
[facility] INPATIENT:  Family/Significant Other Suicide Prevention Education  Suicide Prevention Education:  Patient Refusal for Family/Significant Other Suicide Prevention Education: The patient Douglas Casey has refused to provide written consent for family/significant other to be provided Family/Significant Other Suicide Prevention Education during admission and/or prior to discharge.  Physician notified.  [name] completed with pt, as pt refused to consent to family contact. SPI pamphlet provided to pt and pt was encouraged to share information with support network, ask questions, and talk about any concerns relating to [name]. Pt denies access to guns/firearms and verbalized understanding of information provided. Mobile Crisis information also provided to pt.   Smart, Briele Lagasse LCSW 01/09/2016, 11:33 AM
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
## 2016-01-09 NOTE — Progress Notes (Signed)
NUTRITION ASSESSMENT  Pt identified as at risk on the Malnutrition Screen Tool  INTERVENTION: 1. Educated patient on the importance of nutrition and encouraged intake of food and beverages. 2. Discussed weight goals. 3. Supplements: none at this time.  NUTRITION DIAGNOSIS: Unintentional weight loss related to sub-optimal intake as evidenced by pt report.   Goal: Pt to meet >/= 90% of their estimated nutrition needs.  Monitor:  PO intake  Assessment:  Pt admitted for panic attacks with social anxiety, depression with feelings of hopelessness, and pill abuse. Pt reports that he has not been sleeping well. He also indicates that he has a painful tooth on the R side of his mouth which can be painful during eating.   Pt reports 15 lb weight loss in the past 1 year. No weight hx from this time frame available. Based on CBW, a 15 lb weight loss would indicate 8.5% body weight loss in 1 year which is not significant for time frame.   Continue to encourage good POs of meals and snacks as tolerated.   35 y.o. male  Height: Ht Readings from Last 1 Encounters:  01/08/16 5' 10.25" (1.784 m)    Weight: Wt Readings from Last 1 Encounters:  01/08/16 162 lb 4 oz (73.596 kg)    Weight Hx: Wt Readings from Last 10 Encounters:  01/08/16 162 lb 4 oz (73.596 kg)  03/23/14 172 lb (78.019 kg)  12/25/13 172 lb (78.019 kg)  12/24/13 171 lb 1.2 oz (77.6 kg)    BMI:  Body mass index is 23.12 kg/(m^2). Pt meets criteria for normal weight based on current BMI.  Estimated Nutritional Needs: Kcal: 25-30 kcal/kg Protein: > 1 gram protein/kg Fluid: 1 ml/kcal  Diet Order: Diet regular Room service appropriate?: Yes; Fluid consistency:: Thin Pt is also offered choice of unit snacks mid-morning and mid-afternoon.  Pt is eating as desired.   Lab results and medications reviewed.     Trenton Gammon, MS, RD, LDN Inpatient Clinical Dietitian Pager # [phone] After hours/weekend pager #  [phone]

## 2016-01-09 NOTE — Progress Notes (Signed)
Patient did attend the evening speaker AA meeting.  
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
## 2016-01-09 NOTE — Progress Notes (Signed)
Nursing Note 01/08/16 1900 - 01/09/16 0730  Data Patient being monitored inpatient for opioid and benzodiazepine abuse and panic attacks.  Denies active SI, HI, and AVH.  C/O chronic pain right hip and leg and moderate anxiety.  Polite and appropriate with staff and peers.  Affect relaxed, appropriate, mood "anxious."  Stated meloxicam has helped pain in the past, toradol has helped, and seroquel has helped him in the past with sleep.    Action Spoke with patient 1:1, offered to be an ongoing support for duration of shift.  Given PRN vistaril for anxiety and PRN naproxen and robaxin for pain.  Note left in chart for MD re: toradol, meloxicam, and Seroquel.  Remained on 15 minute checks for safety.  Response Vistaril, naproxen, and Robaxin effective evidenced by patient resting quietly with eyes closed, even non-labored chest rises, shortly after administration.  Remains safe on unit.

## 2016-01-09 NOTE — [facility] Group Notes (Signed)
[facility] LCSW Group Therapy  01/09/2016 12:46 PM  Type of Therapy:  Group Therapy  Participation Level:  Did Not Attend-pt invited. Lethargic and laying in bed. Chose to rest in room.   Modes of Intervention:  Confrontation, Discussion, Education, Exploration, Problem-solving, Rapport Building, Socialization and Support  Summary of Progress/Problems: Feelings around Relapse. Group members discussed the meaning of relapse and shared personal stories of relapse, how it affected them and others, and how they perceived themselves during this time. Group members were encouraged to identify triggers, warning signs and coping skills used when facing the possibility of relapse. Social supports were discussed and explored in detail. Post Acute Withdrawal Syndrome (handout provided) was introduced and examined. Pt's were encouraged to ask questions, talk about key points associated with PAWS, and process this information in terms of relapse prevention.   Smart, Faith Patricelli LCSW 01/09/2016, 12:46 PM

## 2016-01-09 NOTE — [facility] Group Notes (Signed)
[facility] LCSW Aftercare Discharge Planning Group Note   01/09/2016 9:34 AM  Participation Quality:  Appropriate   Mood/Affect:  Depressed  Depression Rating:  6  Anxiety Rating:  8-9  Thoughts of Suicide:  No Will you contract for safety?   NA  Current AVH:  No  Plan for Discharge/Comments:  Pt reports that he has been staying with his sister. He reports pain and anxiety issues. Pt reports med noncompliance but is hoping for referral to [facility]. Pt depressed/flat this morning. Reporting some pain issues.   Transportation Means:  Unknown at this time.   Supports: some family supports   Counselling psychologist, Conservation officer, nature

## 2016-01-09 NOTE — Progress Notes (Signed)
[facility] MD Progress Note  01/09/2016 1:48 PM Douglas Casey  MRN:  [id number]  Subjective: [name] reports, I don't feel good today. I feel very depressed. I'm hurting real bad on my hips. I got a broken tooth. I feel agitated. My mind is racing, I don't sleep well at night"  Objective: Douglas Casey is seen, chart reviewed. He is seen lying down in his bed while group sessions are ongoing. He states that he is feeling very depressed, hurting on his back, has a broken tooth that hurts. He complained of racing thoughts. He says he feels agitated. Looks remains on the Librium/Clonidine protocols for Opioid & Benzodiazepine withdrawal. He is ordered some Orajel for the tooth pain. His Neurontin is increased from 300 mg tid to 400 mg Qid.   Principal Problem: Opioid use disorder, severe, dependence ([place])  Diagnosis:   Patient Active Problem List   Diagnosis Date Noted  . Major depressive disorder, recurrent episode ([place]) [F33.9] 01/08/2016    Priority: High  . Opioid use disorder, severe, dependence ([place]) [F11.20] 01/08/2016    Priority: High  . Severe benzodiazepine use disorder [F13.90] 01/08/2016    Priority: High  . MDD (major depressive disorder), recurrent episode, severe ([place]) [F33.2] 12/25/2013  . Drug overdose, intentional ([place]) [T50.902A] 12/24/2013  . Hypertension [I10] 12/24/2013  . Overdose [T50.901A] 12/24/2013   Total Time spent with patient: 25 minutes  Past Psychiatric History: Opioid use disorder, Benzodiazepine use disorder  Past Medical History:  Past Medical History  Diagnosis Date  . Hypertension   . Asthma   . PONV (postoperative nausea and vomiting)     Past Surgical History  Procedure Laterality Date  . Hip surgery    . Ankle fracture surgery    . Hip pinning Right May 2001  . Ankle closed reduction Right July 2011  . No past surgeries      surgery rt hip, leg and foot   Family History: History reviewed. No pertinent family history.  Family Psychiatric  History: See  H&P  Social History:  History  Alcohol Use  . Yes    Comment: " OCCASIONAL " 2-3 beers a month     History  Drug Use  . Yes    Comment: prescribed oxycodone and xanax    Social History   Social History  . Marital Status: Single    Spouse Name: N/A  . Number of Children: N/A  . Years of Education: N/A   Social History Main Topics  . Smoking status: Current Every Day Smoker -- 1.00 packs/day for 20 years    Types: Cigarettes  . Smokeless tobacco: Never Used     Comment: PATIENT DENIES  . Alcohol Use: Yes     Comment: " OCCASIONAL " 2-3 beers a month  . Drug Use: Yes     Comment: prescribed oxycodone and xanax  . Sexual Activity: Yes    Birth Control/ Protection: None   Other Topics Concern  . None   Social History Narrative   Additional Social History:   Sleep: 6.75 per documentation, however, patient reports not sleeping well.  Appetite:  Fair  Current Medications: Current Facility-Administered Medications  Medication Dose Route Frequency Provider Last Rate Last Dose  . acetaminophen (TYLENOL) tablet 650 mg  650 mg Oral Q6H PRN Encarnacion Slates, NP   650 mg at 01/09/16 0810  . alum & mag hydroxide-simeth (MAALOX/MYLANTA) 200-200-20 MG/5ML suspension 30 mL  30 mL Oral Q4H PRN Encarnacion Slates, NP   30 mL  at 01/08/16 1509  . chlordiazePOXIDE (LIBRIUM) capsule 25 mg  25 mg Oral Q6H PRN Encarnacion Slates, NP      . chlordiazePOXIDE (LIBRIUM) capsule 25 mg  25 mg Oral QID Encarnacion Slates, NP   25 mg at 01/09/16 1153   Followed by  . [START ON 01/10/2016] chlordiazePOXIDE (LIBRIUM) capsule 25 mg  25 mg Oral TID Encarnacion Slates, NP       Followed by  . [START ON 01/11/2016] chlordiazePOXIDE (LIBRIUM) capsule 25 mg  25 mg Oral BH-qamhs Encarnacion Slates, NP       Followed by  . [START ON 01/12/2016] chlordiazePOXIDE (LIBRIUM) capsule 25 mg  25 mg Oral Daily Encarnacion Slates, NP      . cloNIDine (CATAPRES) tablet 0.1 mg  0.1 mg Oral QID Encarnacion Slates, NP   0.1 mg at 01/09/16 1153   Followed  by  . [START ON 01/11/2016] cloNIDine (CATAPRES) tablet 0.1 mg  0.1 mg Oral BH-qamhs Encarnacion Slates, NP       Followed by  . [START ON 01/13/2016] cloNIDine (CATAPRES) tablet 0.1 mg  0.1 mg Oral QAC breakfast Encarnacion Slates, NP      . dicyclomine (BENTYL) tablet 20 mg  20 mg Oral Q6H PRN Encarnacion Slates, NP      . gabapentin (NEURONTIN) capsule 300 mg  300 mg Oral TID PC & HS Encarnacion Slates, NP   300 mg at 01/09/16 1207  . hydrOXYzine (ATARAX/VISTARIL) tablet 25 mg  25 mg Oral Q6H PRN Encarnacion Slates, NP   25 mg at 01/08/16 2154  . loperamide (IMODIUM) capsule 2-4 mg  2-4 mg Oral PRN Encarnacion Slates, NP      . magnesium hydroxide (MILK OF MAGNESIA) suspension 30 mL  30 mL Oral Daily PRN Encarnacion Slates, NP      . methocarbamol (ROBAXIN) tablet 500 mg  500 mg Oral Q8H PRN Encarnacion Slates, NP   500 mg at 01/08/16 2155  . multivitamin with minerals tablet 1 tablet  1 tablet Oral Daily Encarnacion Slates, NP   1 tablet at 01/09/16 3614  . naproxen (NAPROSYN) tablet 500 mg  500 mg Oral BID PRN Encarnacion Slates, NP   500 mg at 01/09/16 1057  . nicotine (NICODERM CQ - dosed in mg/24 hours) patch 21 mg  21 mg Transdermal Daily Encarnacion Slates, NP   21 mg at 01/09/16 0808  . ondansetron (ZOFRAN-ODT) disintegrating tablet 4 mg  4 mg Oral Q6H PRN Encarnacion Slates, NP   4 mg at 01/08/16 1538  . thiamine (VITAMIN B-1) tablet 100 mg  100 mg Oral Daily Encarnacion Slates, NP   100 mg at 01/09/16 0100  . traZODone (DESYREL) tablet 50 mg  50 mg Oral QHS PRN Encarnacion Slates, NP       Lab Results:  Results for orders placed or performed during the hospital encounter of 01/08/16 (from the past 48 hour(s))  CBC with Differential/Platelet     Status: Abnormal   Collection Time: 01/09/16  6:15 AM  Result Value Ref Range   WBC 9.9 4.0 - 10.5 K/uL   RBC 4.26 4.22 - 5.81 MIL/uL   Hemoglobin 12.6 (L) 13.0 - 17.0 g/dL   HCT 37.5 (L) 39.0 - 52.0 %   MCV 88.0 78.0 - 100.0 fL   MCH 29.6 26.0 - 34.0 pg   MCHC 33.6 30.0 - 36.0 g/dL   RDW 12.5  11.5 - 15.5  %   Platelets 233 150 - 400 K/uL   Neutrophils Relative % 56 %   Neutro Abs 5.6 1.7 - 7.7 K/uL   Lymphocytes Relative 31 %   Lymphs Abs 3.0 0.7 - 4.0 K/uL   Monocytes Relative 9 %   Monocytes Absolute 0.9 0.1 - 1.0 K/uL   Eosinophils Relative 4 %   Eosinophils Absolute 0.4 0.0 - 0.7 K/uL   Basophils Relative 0 %   Basophils Absolute 0.0 0.0 - 0.1 K/uL    Comment: Performed at [facility] metabolic panel     Status: Abnormal   Collection Time: 01/09/16  6:15 AM  Result Value Ref Range   Sodium 140 135 - 145 mmol/L   Potassium 3.7 3.5 - 5.1 mmol/L   Chloride 108 101 - 111 mmol/L   CO2 27 22 - 32 mmol/L   Glucose, Bld 105 (H) 65 - 99 mg/dL   BUN 10 6 - 20 mg/dL   Creatinine, Ser 0.92 0.61 - 1.24 mg/dL   Calcium 9.1 8.9 - 10.3 mg/dL   GFR calc non Af Amer >60 >60 mL/min   GFR calc Af Amer >60 >60 mL/min    Comment: (NOTE) The eGFR has been calculated using the CKD EPI equation. This calculation has not been validated in all clinical situations. eGFR's persistently <60 mL/min signify possible Chronic Kidney Disease.    Anion gap 5 5 - 15    Comment: Performed at [facility]   Blood Alcohol level:  Lab Results  Component Value Date   [facility] <11 49/70/2637   Metabolic Disorder Labs: No results found for: HGBA1C, MPG No results found for: PROLACTIN No results found for: CHOL, TRIG, HDL, CHOLHDL, VLDL, LDLCALC  Physical Findings: AIMS: Facial and Oral Movements Muscles of Facial Expression: None, normal Lips and Perioral Area: None, normal Jaw: None, normal Tongue: None, normal,Extremity Movements Upper (arms, wrists, hands, fingers): None, normal Lower (legs, knees, ankles, toes): None, normal, Trunk Movements Neck, shoulders, hips: None, normal, Overall Severity Severity of abnormal movements (highest score from questions above): None, normal Incapacitation due to abnormal movements: None, normal Patient's awareness of  abnormal movements (rate only patient's report): No Awareness, Dental Status Current problems with teeth and/or dentures?: Yes Does patient usually wear dentures?: No  CIWA:  CIWA-Ar Total: 0 COWS:  COWS Total Score: 5  Musculoskeletal: Strength & Muscle Tone: within normal limits Gait & Station: normal Patient leans: N/A  Psychiatric Specialty Exam: Physical Exam  ROS  Blood pressure 144/85, pulse 99, temperature 97.7 F (36.5 C), temperature source Oral, resp. rate 16, height 5' 10.25" (1.784 m), weight 73.596 kg (162 lb 4 oz).Body mass index is 23.12 kg/(m^2).  General Appearance: Casual.  Eye Contact: Fair  Speech: Clear and Coherent  Volume: Normal  Mood: Depressed  Affect: Flat  Thought Process: Coherent and Goal Directed  Orientation: Full (Time, Place, and Person)  Thought Content: Rumination, Denies any hallucinations  Suicidal Thoughts: Denies any thoughts, plans or intent  Homicidal Thoughts: Denies any thoughts, plans or intent  Memory: Immediate; Good Recent; Good Remote; Good  Judgement: Fair  Insight: Present  Psychomotor Activity: Restlessness  Concentration: Concentration: Fair and Attention Span: Fair  Recall: Good  Fund of Knowledge: Fair  Language: Good  Akathisia: Negative  Handed: Right  AIMS (if indicated):    Assets: Communication Skills Desire for Improvement  ADL's: Intact  Cognition: WNL  Sleep:  Treatment Plan Summary: Daily contact with patient to assess and evaluate symptoms and progress in treatment and Medication management: Treatment plan/Summary: 1. Continue crisis management, mood stabilization & relapse prevention.. 2. Continue current medication management to reduce current symptoms to base line and improve the  patient's overall level of functioning: Opioid/Benzodiazepine withdrawal: continue the Librium/Clonidine detox protocols. Agitation/Substance withdrawal  symptoms: Increased the Neurontin to 400 mg QID. Insomnia: Increased the Trazodone to 100 mg q hs 3. Treat health problems as indicated; Resumed the Albuterol for SOB/Asthma, initiate Orajel for tooth pain. 4. Develop treatment plan to enhance medication adeherance upon discharge & prevent the need for  readmission. 5. Psycho-social education regarding relapse prevention and self care. 6. Will continue PRN treatment regimen per protocols. 7.Monitor vital signs, review pertinent findings or order labs when necessary. 8. Social Worker to work on discharge disposition  Encarnacion Slates, NP< PMHNP, FNP-BC. 01/09/2016, 1:48 PM Agree with NP Progress Note as above

## 2016-01-10 MED ORDER — KETOROLAC TROMETHAMINE 10 MG PO TABS
10.0000 mg | ORAL_TABLET | Freq: Three times a day (TID) | ORAL | Status: DC | PRN
  Administered 2016-01-10 – 2016-01-12 (×5): 10 mg via ORAL
  Filled 2016-01-10 (×5): qty 1

## 2016-01-10 MED ORDER — QUETIAPINE FUMARATE 50 MG PO TABS
50.0000 mg | ORAL_TABLET | Freq: Every day | ORAL | Status: DC
  Administered 2016-01-10: 50 mg via ORAL
  Filled 2016-01-10 (×3): qty 1

## 2016-01-10 MED ORDER — QUETIAPINE FUMARATE 100 MG PO TABS
100.0000 mg | ORAL_TABLET | Freq: Every day | ORAL | Status: DC
  Filled 2016-01-10 (×2): qty 1

## 2016-01-10 MED ORDER — LIDOCAINE 5 % EX PTCH
2.0000 | MEDICATED_PATCH | CUTANEOUS | Status: DC
  Administered 2016-01-10 – 2016-01-13 (×3): 2 via TRANSDERMAL
  Administered 2016-01-14 (×2): 1 via TRANSDERMAL
  Administered 2016-01-15 – 2016-01-16 (×2): 2 via TRANSDERMAL
  Filled 2016-01-10 (×9): qty 2

## 2016-01-10 MED ORDER — GABAPENTIN 300 MG PO CAPS
600.0000 mg | ORAL_CAPSULE | Freq: Three times a day (TID) | ORAL | Status: DC
Start: 2016-01-10 — End: 2016-01-12
  Administered 2016-01-10 – 2016-01-12 (×6): 600 mg via ORAL
  Filled 2016-01-10 (×11): qty 2

## 2016-01-10 NOTE — Progress Notes (Signed)
Patient did attend the evening speaker AA meeting.  

## 2016-01-10 NOTE — Progress Notes (Signed)
Writer spoke with patient 1:1 and he reports that his day has been ok but his pain  has been rough which makes him more depressed. He received his scheduled medications and reports that he doesn't want to take trazadone b/c it causes him to have restless legs and then feels very groggy once he gets up in the am. He attended AA meeting and was observed interacted with peers in the dayroom. Support given and safety maintained on unit with 15 min checks.

## 2016-01-10 NOTE — Progress Notes (Signed)
D: Patient's self inventory sheet: patient has poor sleep, recieved sleep medication without relief, feels he would do better with Seroquel vs. Trazadone.good  Appetite, low energy level, poor concentration. Rated depression 5/10, hopeless 5/10, anxiety 7/10. SI/HI/AVH: Denies. Physical complaints are severe chronic pain and opiate withdrawal. Goal is "try to get some sleep and less pain". Plans to work on "not sure" and "I'd like to see my doctor".  Patient reports frustration because he has numerous physical problems due to old injuries and has taken opiate pain medications for many years. He admits that he has taken them inappropriately. He was hoping that there was a rehab program that he could go to that would help him return to appropriate use of the medications without abusing them, but there do not appear to be any rehab programs that will accept him on pain medications. A: Medications administered, assessed medication knowledge and education given on medication regimen.  Emotional support and encouragement given patient. R: Denies SI and HI , contracts for safety. Safety maintained with 15 minute checks.

## 2016-01-10 NOTE — [facility] Group Notes (Signed)
  [facility] LCSW Group Therapy Note  01/10/2016 at 10:00 AM  Type of Therapy and Topic:  Group Therapy: Avoiding Self-Sabotaging and Enabling Behaviors  Participation Level:  Active  Participation Quality:  Attentive and Sharing  Affect:  Depressed  Cognitive:  Alert and Oriented  Insight:  Limited  Engagement in Therapy:  Developing/Improving   Therapeutic models used: Cognitive Behavioral Therapy,  Person-Centered Therapy and Motivational Interviewing  Modes of Intervention:  Discussion, Exploration, Orientation, Rapport Building, Socialization and Support   Summary of Progress/Problems:  The main focus of today's process group was for the patient to identify ways in which they have in the past sabotaged their own recovery. Motivational Interviewing was utilized to identify motivation they may have for wanting to change. Patient was hesitant to come to group yet did when told he could leave at any time should he become uncomfortable due to pain yet he came and remained for entire group. Patient shared that his first thought each day relate to the quantity of medications he has access to. Patient reports he has never been unable to obtain what he wants. Patient unable to identify what would be necessary for him him to remain clean. Pt was willing to consider the possibility he will not always be in pain.   Carney Bern, LCSW

## 2016-01-10 NOTE — Progress Notes (Signed)
[facility] MD Progress Note  01/10/2016 9:25 AM Douglas Casey  MRN:  [id number]  Subjective: Douglas Casey reports " I am in so much pain, that's why I feel like I am unable to sleep."  patient is requesting to be started on Xanax . States that this helped with his pain and anxiety.  Objective: Douglas Casey is awake, alert and oriented X4. Seen standing at the nursing station. Denies suicidal or homicidal ideation. Denies auditory or visual hallucination and does not appear to be responding to internal stimuli. Patient reports interacting well with staff and others. Patient reports he is medication compliant without mediation side effects. States his depression 8/10. Reports good appetite and he is not resting well thought the night reports that he trazodone. Patient reports taken Seroquel in the past which helped with insomnia. Patient is ruminative about pain medications. Support, encouragement and reassurance was provided.    Principal Problem: Opioid use disorder, severe, dependence ([place])  Diagnosis:   Patient Active Problem List   Diagnosis Date Noted  . Major depressive disorder, recurrent episode ([place]) [F33.9] 01/08/2016  . Opioid use disorder, severe, dependence ([place]) [F11.20] 01/08/2016  . Severe benzodiazepine use disorder [F13.90] 01/08/2016  . MDD (major depressive disorder), recurrent episode, severe ([place]) [F33.2] 12/25/2013  . Drug overdose, intentional ([place]) [T50.902A] 12/24/2013  . Hypertension [I10] 12/24/2013  . Overdose [T50.901A] 12/24/2013   Total Time spent with patient: 25 minutes  Past Psychiatric History: Opioid use disorder, Benzodiazepine use disorder  Past Medical History:  Past Medical History  Diagnosis Date  . Hypertension   . Asthma   . PONV (postoperative nausea and vomiting)     Past Surgical History  Procedure Laterality Date  . Hip surgery    . Ankle fracture surgery    . Hip pinning Right May 2001  . Ankle closed reduction Right July 2011  . No past surgeries       surgery rt hip, leg and foot   Family History: History reviewed. No pertinent family history.  Family Psychiatric  History: See H&P  Social History:  History  Alcohol Use  . Yes    Comment: " OCCASIONAL " 2-3 beers a month     History  Drug Use  . Yes    Comment: prescribed oxycodone and xanax    Social History   Social History  . Marital Status: Single    Spouse Name: N/A  . Number of Children: N/A  . Years of Education: N/A   Social History Main Topics  . Smoking status: Current Every Day Smoker -- 1.00 packs/day for 20 years    Types: Cigarettes  . Smokeless tobacco: Never Used     Comment: PATIENT DENIES  . Alcohol Use: Yes     Comment: " OCCASIONAL " 2-3 beers a month  . Drug Use: Yes     Comment: prescribed oxycodone and xanax  . Sexual Activity: Yes    Birth Control/ Protection: None   Other Topics Concern  . None   Social History Narrative   Additional Social History:   Sleep: 6.75 per documentation, however, patient reports not sleeping well.  Appetite:  Fair  Current Medications: Current Facility-Administered Medications  Medication Dose Route Frequency Provider Last Rate Last Dose  . acetaminophen (TYLENOL) tablet 650 mg  650 mg Oral Q6H PRN Encarnacion Slates, NP   650 mg at 01/10/16 0755  . albuterol (PROVENTIL HFA;VENTOLIN HFA) 108 (90 Base) MCG/ACT inhaler 2 puff  2 puff Inhalation Q6H PRN Loleta Dicker  [place], NP   2 puff at 01/10/16 0311  . alum & mag hydroxide-simeth (MAALOX/MYLANTA) 200-200-20 MG/5ML suspension 30 mL  30 mL Oral Q4H PRN Encarnacion Slates, NP   30 mL at 01/08/16 1509  . benzocaine (ORAJEL) 10 % mucosal gel   Mouth/Throat QID PRN Encarnacion Slates, NP      . chlordiazePOXIDE (LIBRIUM) capsule 25 mg  25 mg Oral Q6H PRN Encarnacion Slates, NP   25 mg at 01/10/16 0750  . chlordiazePOXIDE (LIBRIUM) capsule 25 mg  25 mg Oral TID Encarnacion Slates, NP   25 mg at 01/10/16 0753   Followed by  . [START ON 01/11/2016] chlordiazePOXIDE (LIBRIUM) capsule 25 mg   25 mg Oral BH-qamhs Encarnacion Slates, NP       Followed by  . [START ON 01/12/2016] chlordiazePOXIDE (LIBRIUM) capsule 25 mg  25 mg Oral Daily Encarnacion Slates, NP      . cloNIDine (CATAPRES) tablet 0.1 mg  0.1 mg Oral QID Encarnacion Slates, NP   0.1 mg at 01/10/16 0748   Followed by  . [START ON 01/11/2016] cloNIDine (CATAPRES) tablet 0.1 mg  0.1 mg Oral BH-qamhs Encarnacion Slates, NP       Followed by  . [START ON 01/13/2016] cloNIDine (CATAPRES) tablet 0.1 mg  0.1 mg Oral QAC breakfast Encarnacion Slates, NP      . dicyclomine (BENTYL) tablet 20 mg  20 mg Oral Q6H PRN Encarnacion Slates, NP      . gabapentin (NEURONTIN) capsule 400 mg  400 mg Oral TID PC & HS Encarnacion Slates, NP   400 mg at 01/10/16 0910  . hydrOXYzine (ATARAX/VISTARIL) tablet 25 mg  25 mg Oral Q6H PRN Encarnacion Slates, NP   25 mg at 01/09/16 2205  . loperamide (IMODIUM) capsule 2-4 mg  2-4 mg Oral PRN Encarnacion Slates, NP      . magnesium hydroxide (MILK OF MAGNESIA) suspension 30 mL  30 mL Oral Daily PRN Encarnacion Slates, NP      . methocarbamol (ROBAXIN) tablet 500 mg  500 mg Oral Q8H PRN Encarnacion Slates, NP   500 mg at 01/10/16 0753  . multivitamin with minerals tablet 1 tablet  1 tablet Oral Daily Encarnacion Slates, NP   1 tablet at 01/10/16 0747  . naproxen (NAPROSYN) tablet 500 mg  500 mg Oral BID PRN Encarnacion Slates, NP   500 mg at 01/10/16 0311  . nicotine (NICODERM CQ - dosed in mg/24 hours) patch 21 mg  21 mg Transdermal Daily Encarnacion Slates, NP   21 mg at 01/10/16 0748  . ondansetron (ZOFRAN-ODT) disintegrating tablet 4 mg  4 mg Oral Q6H PRN Encarnacion Slates, NP   4 mg at 01/08/16 1538  . QUEtiapine (SEROQUEL) tablet 100 mg  100 mg Oral QHS Derrill Center, NP      . thiamine (VITAMIN B-1) tablet 100 mg  100 mg Oral Daily Encarnacion Slates, NP   100 mg at 01/10/16 0747   Lab Results:  Results for orders placed or performed during the hospital encounter of 01/08/16 (from the past 48 hour(s))  CBC with Differential/Platelet     Status: Abnormal   Collection Time:  01/09/16  6:15 AM  Result Value Ref Range   WBC 9.9 4.0 - 10.5 K/uL   RBC 4.26 4.22 - 5.81 MIL/uL   Hemoglobin 12.6 (L) 13.0 - 17.0 g/dL   HCT 37.5 (L)  39.0 - 52.0 %   MCV 88.0 78.0 - 100.0 fL   MCH 29.6 26.0 - 34.0 pg   MCHC 33.6 30.0 - 36.0 g/dL   RDW 12.5 11.5 - 15.5 %   Platelets 233 150 - 400 K/uL   Neutrophils Relative % 56 %   Neutro Abs 5.6 1.7 - 7.7 K/uL   Lymphocytes Relative 31 %   Lymphs Abs 3.0 0.7 - 4.0 K/uL   Monocytes Relative 9 %   Monocytes Absolute 0.9 0.1 - 1.0 K/uL   Eosinophils Relative 4 %   Eosinophils Absolute 0.4 0.0 - 0.7 K/uL   Basophils Relative 0 %   Basophils Absolute 0.0 0.0 - 0.1 K/uL    Comment: Performed at [facility] metabolic panel     Status: Abnormal   Collection Time: 01/09/16  6:15 AM  Result Value Ref Range   Sodium 140 135 - 145 mmol/L   Potassium 3.7 3.5 - 5.1 mmol/L   Chloride 108 101 - 111 mmol/L   CO2 27 22 - 32 mmol/L   Glucose, Bld 105 (H) 65 - 99 mg/dL   BUN 10 6 - 20 mg/dL   Creatinine, Ser 0.92 0.61 - 1.24 mg/dL   Calcium 9.1 8.9 - 10.3 mg/dL   GFR calc non Af Amer >60 >60 mL/min   GFR calc Af Amer >60 >60 mL/min    Comment: (NOTE) The eGFR has been calculated using the CKD EPI equation. This calculation has not been validated in all clinical situations. eGFR's persistently <60 mL/min signify possible Chronic Kidney Disease.    Anion gap 5 5 - 15    Comment: Performed at [facility]   Blood Alcohol level:  Lab Results  Component Value Date   [facility] <11 25/42/7062   Metabolic Disorder Labs: No results found for: HGBA1C, MPG No results found for: PROLACTIN No results found for: CHOL, TRIG, HDL, CHOLHDL, VLDL, LDLCALC  Physical Findings: AIMS: Facial and Oral Movements Muscles of Facial Expression: None, normal Lips and Perioral Area: None, normal Jaw: None, normal Tongue: None, normal,Extremity Movements Upper (arms, wrists, hands, fingers): None, normal Lower  (legs, knees, ankles, toes): None, normal, Trunk Movements Neck, shoulders, hips: None, normal, Overall Severity Severity of abnormal movements (highest score from questions above): None, normal Incapacitation due to abnormal movements: None, normal Patient's awareness of abnormal movements (rate only patient's report): No Awareness, Dental Status Current problems with teeth and/or dentures?: Yes Does patient usually wear dentures?: No  CIWA:  CIWA-Ar Total: 0 COWS:  COWS Total Score: 0  Musculoskeletal: Strength & Muscle Tone: within normal limits Gait & Station: normal Patient leans: N/A  Psychiatric Specialty Exam: Physical Exam  Nursing note and vitals reviewed. Constitutional: He is oriented to person, place, and time. He appears well-developed.  HENT:  Head: Normocephalic.  Cardiovascular: Normal rate and intact distal pulses.   Musculoskeletal: Normal range of motion.  Neurological: He is alert and oriented to person, place, and time.  Skin: Skin is warm.  Psychiatric: He has a normal mood and affect. His behavior is normal. Thought content normal.    Review of Systems  Musculoskeletal: Positive for joint pain.       Back and hip pain  Psychiatric/Behavioral: Positive for depression and substance abuse. Negative for suicidal ideas. The patient is nervous/anxious.   All other systems reviewed and are negative.   Blood pressure 129/76, pulse 63, temperature 98.1 F (36.7 C), temperature source Oral, resp. rate 16,  height 5' 10.25" (1.784 m), weight 73.596 kg (162 lb 4 oz), SpO2 99 %.Body mass index is 23.12 kg/(m^2).  General Appearance: Casual, Fairly groomed   Eye Contact: Fair  Speech: Clear and Coherent  Volume: Normal  Mood: Depressed  Affect: Flat  Thought Process: Coherent and Goal Directed  Orientation: Full (Time, Place, and Person)  Thought Content: Rumination, Denies any hallucinations  Suicidal Thoughts: Denies any thoughts, plans or  intent  Homicidal Thoughts: Denies any thoughts, plans or intent  Memory: Immediate; Good Recent; Good Remote; Good  Judgement: Fair  Insight: Present  Psychomotor Activity: Restlessness  Concentration: Concentration: Fair and Attention Span: Fair  Recall: Good  Fund of Knowledge: Fair  Language: Good  Akathisia: Negative  Handed: Right  AIMS (if indicated):    Assets: Communication Skills Desire for Improvement  ADL's: Intact  Cognition: WNL  Sleep:             I agree with current treatment plan on 01/10/2016, Patient seen face-to-face for psychiatric evaluation follow-up, chart reviewed.  Reviewed the information documented and agree with the treatment plan.  Treatment Plan Summary: Daily contact with patient to assess and evaluate symptoms and progress in treatment and Medication management:   Treatment plan/Summary: 1. Continue crisis management, mood stabilization & relapse prevention.. 2. Continue current medication management to reduce current symptoms to base line and improve the  patient's overall level of functioning: Opioid/Benzodiazepine withdrawal: continue the Librium/Clonidine detox protocols. Agitation/Substance withdrawal symptoms: Continue  Neurontin to 400 mg QID. Insomnia: Discontinue the Trazodone to 100 mg q hs Start Seroquel 63m PO QH'S for insomnia Start Lidocaine Patch 5% for back and leg pain 3. Treat health problems as indicated; Continue  Albuterol for SOB/Asthma Continue Orajel for tooth pain. 4. Develop treatment plan to enhance medication adherence upon discharge & prevent the need for  readmission. 5. Psycho-social education regarding relapse prevention and self care. 6. Will continue PRN treatment regimen per protocols. 7.Monitor vital signs, review pertinent findings or order labs when necessary. 8. Social Worker to work on discharge disposition  [place]errill Center NP 01/10/2016, 9:25 AM  Agree  with NP Progress Note as above

## 2016-01-11 MED ORDER — QUETIAPINE FUMARATE 100 MG PO TABS
100.0000 mg | ORAL_TABLET | Freq: Every day | ORAL | Status: DC
  Administered 2016-01-11: 100 mg via ORAL
  Filled 2016-01-11 (×3): qty 1

## 2016-01-11 NOTE — Progress Notes (Signed)
[facility] MD Progress Note  01/11/2016 8:06 AM Douglas Casey  MRN:  [id number]  Subjective: Douglas Casey reports " I had a much better night, I was able to sleep for 3 hours."   Objective: Douglas Casey is awake, alert and oriented X4. Seen sitting in the dayroom interacting with peers. Patient is ruminative regarding medications. Librium and pain medications.  Denies suicidal or homicidal ideation. Denies auditory or visual hallucination and does not appear to be responding to internal stimuli. Patient reports interacting well with staff and others. Patient reports he is medication compliant without mediation side effects. States his depression is still 8/10. Reports good appetite and states resting better with the Seroquel adjustment.   Support, encouragement and reassurance was provided.    Principal Problem: Opioid use disorder, severe, dependence ([facility])  Diagnosis:   Patient Active Problem List   Diagnosis Date Noted  . Major depressive disorder, recurrent episode ([facility]) [F33.9] 01/08/2016  . Opioid use disorder, severe, dependence ([facility]) [F11.20] 01/08/2016  . Severe benzodiazepine use disorder [F13.90] 01/08/2016  . MDD (major depressive disorder), recurrent episode, severe ([facility]) [F33.2] 12/25/2013  . Drug overdose, intentional ([facility]) [T50.902A] 12/24/2013  . Hypertension [I10] 12/24/2013  . Overdose [T50.901A] 12/24/2013   Total Time spent with patient: 25 minutes  Past Psychiatric History: Opioid use disorder, Benzodiazepine use disorder  Past Medical History:  Past Medical History  Diagnosis Date  . Hypertension   . Asthma   . PONV (postoperative nausea and vomiting)     Past Surgical History  Procedure Laterality Date  . Hip surgery    . Ankle fracture surgery    . Hip pinning Right May 2001  . Ankle closed reduction Right July 2011  . No past surgeries      surgery rt hip, leg and foot   Family History: History reviewed. No pertinent family history.  Family Psychiatric  History: See  H&P  Social History:  History  Alcohol Use  . Yes    Comment: " OCCASIONAL " 2-3 beers a month     History  Drug Use  . Yes    Comment: prescribed oxycodone and xanax    Social History   Social History  . Marital Status: Single    Spouse Name: N/A  . Number of Children: N/A  . Years of Education: N/A   Social History Main Topics  . Smoking status: Current Every Day Smoker -- 1.00 packs/day for 20 years    Types: Cigarettes  . Smokeless tobacco: Never Used     Comment: PATIENT DENIES  . Alcohol Use: Yes     Comment: " OCCASIONAL " 2-3 beers a month  . Drug Use: Yes     Comment: prescribed oxycodone and xanax  . Sexual Activity: Yes    Birth Control/ Protection: None   Other Topics Concern  . None   Social History Narrative   Additional Social History:   Sleep: 6.75 per documentation, however, patient reports not sleeping well.  Appetite:  Fair  Current Medications: Current Facility-Administered Medications  Medication Dose Route Frequency Provider Last Rate Last Dose  . acetaminophen (TYLENOL) tablet 650 mg  650 mg Oral Q6H PRN [name], NP   650 mg at 01/10/16 0755  . albuterol (PROVENTIL HFA;VENTOLIN HFA) 108 (90 Base) MCG/ACT inhaler 2 puff  2 puff Inhalation Q6H PRN [name], NP   2 puff at 01/10/16 0311  . alum & mag hydroxide-simeth (MAALOX/MYLANTA) 200-200-20 MG/5ML suspension 30 mL  30 mL Oral Q4H  PRN [name], NP   30 mL at 01/08/16 1509  . benzocaine (ORAJEL) 10 % mucosal gel   Mouth/Throat QID PRN [name], NP      . chlordiazePOXIDE (LIBRIUM) capsule 25 mg  25 mg Oral Q6H PRN [name], NP   25 mg at 01/10/16 0750  . chlordiazePOXIDE (LIBRIUM) capsule 25 mg  25 mg Oral BH-qamhs [name], NP   25 mg at 01/11/16 0800   Followed by  . [START ON 01/12/2016] chlordiazePOXIDE (LIBRIUM) capsule 25 mg  25 mg Oral Daily [name], NP      . cloNIDine (CATAPRES) tablet 0.1 mg  0.1 mg Oral BH-qamhs [name], NP   0.1 mg at  01/11/16 0800   Followed by  . [START ON 01/13/2016] cloNIDine (CATAPRES) tablet 0.1 mg  0.1 mg Oral QAC breakfast [name], NP      . dicyclomine (BENTYL) tablet 20 mg  20 mg Oral Q6H PRN [name], NP      . gabapentin (NEURONTIN) capsule 600 mg  600 mg Oral TID Craige Cotta, MD   600 mg at 01/11/16 0800  . hydrOXYzine (ATARAX/VISTARIL) tablet 25 mg  25 mg Oral Q6H PRN [name], NP   25 mg at 01/09/16 2205  . ketorolac (TORADOL) tablet 10 mg  10 mg Oral Q8H PRN Craige Cotta, MD   10 mg at 01/11/16 0555  . lidocaine (LIDODERM) 5 % 2 patch  2 patch Transdermal Q24H Oneta Rack, NP   2 patch at 01/10/16 1113  . loperamide (IMODIUM) capsule 2-4 mg  2-4 mg Oral PRN [name], NP      . magnesium hydroxide (MILK OF MAGNESIA) suspension 30 mL  30 mL Oral Daily PRN [name], NP      . methocarbamol (ROBAXIN) tablet 500 mg  500 mg Oral Q8H PRN [name], NP   500 mg at 01/10/16 1544  . multivitamin with minerals tablet 1 tablet  1 tablet Oral Daily [name], NP   1 tablet at 01/11/16 0759  . naproxen (NAPROSYN) tablet 500 mg  500 mg Oral BID PRN [name], NP   500 mg at 01/10/16 1544  . nicotine (NICODERM CQ - dosed in mg/24 hours) patch 21 mg  21 mg Transdermal Daily [name], NP   21 mg at 01/11/16 0801  . ondansetron (ZOFRAN-ODT) disintegrating tablet 4 mg  4 mg Oral Q6H PRN [name], NP   4 mg at 01/08/16 1538  . QUEtiapine (SEROQUEL) tablet 50 mg  50 mg Oral QHS Oneta Rack, NP   50 mg at 01/10/16 2204  . thiamine (VITAMIN B-1) tablet 100 mg  100 mg Oral Daily [name], NP   100 mg at 01/11/16 1191   Lab Results:  No results found for this or any previous visit (from the past 48 hour(s)). Blood Alcohol level:  Lab Results  Component Value Date   [employer] <11 12/23/2013   Metabolic Disorder Labs: No results found for: HGBA1C, MPG No results found for: PROLACTIN No results found for: CHOL, TRIG, HDL, CHOLHDL, VLDL,  LDLCALC  Physical Findings: AIMS: Facial and Oral Movements Muscles of Facial Expression: None, normal Lips and Perioral Area: None, normal Jaw: None, normal Tongue: None, normal,Extremity Movements Upper (arms, wrists, hands, fingers): None, normal Lower (legs, knees, ankles, toes): None, normal, Trunk Movements Neck, shoulders, hips: None,  normal, Overall Severity Severity of abnormal movements (highest score from questions above): None, normal Incapacitation due to abnormal movements: None, normal Patient's awareness of abnormal movements (rate only patient's report): No Awareness, Dental Status Current problems with teeth and/or dentures?: No Does patient usually wear dentures?: No  CIWA:  CIWA-Ar Total: 2 COWS:  COWS Total Score: 0  Musculoskeletal: Strength & Muscle Tone: within normal limits Gait & Station: normal, pain to right hip. limping but steady gait Patient leans: N/A  Psychiatric Specialty Exam: Physical Exam  Nursing note and vitals reviewed. Constitutional: He is oriented to person, place, and time. He appears well-developed.  HENT:  Head: Normocephalic.  Cardiovascular: Normal rate and intact distal pulses.   Musculoskeletal: Normal range of motion.  Neurological: He is alert and oriented to person, place, and time.  Skin: Skin is warm.  Psychiatric: He has a normal mood and affect. His behavior is normal. Thought content normal.    Review of Systems  Musculoskeletal: Positive for joint pain.       Back and hip pain  Psychiatric/Behavioral: Positive for depression. Negative for suicidal ideas, hallucinations and substance abuse. The patient is nervous/anxious and has insomnia.   All other systems reviewed and are negative.   Blood pressure 114/71, pulse 70, temperature 97.9 F (36.6 C), temperature source Oral, resp. rate 16, height 5' 10.25" (1.784 m), weight 73.596 kg (162 lb 4 oz), SpO2 99 %.Body mass index is 23.12 kg/(m^2).  General Appearance:  Fairly groomed   Eye Contact: Fair  Speech: Clear and Coherent  Volume: Normal  Mood: Depressed  Affect: Flat  Thought Process: Coherent and Goal Directed  Orientation: Full (Time, Place, and Person)  Thought Content: Rumination, Denies any hallucinations  Suicidal Thoughts: Denies any thoughts, plans or intent  Homicidal Thoughts: Denies any thoughts, plans or intent  Memory: Immediate; Good Recent; Good Remote; Good  Judgement: Fair  Insight: Present  Psychomotor Activity: Restlessness  Concentration: Concentration: Fair and Attention Span: Fair  Recall: Good  Fund of Knowledge: Fair  Language: Good  Akathisia: Negative  Handed: Right  AIMS (if indicated):    Assets: Communication Skills Desire for Improvement  ADL's: Intact  Cognition: WNL  Sleep:            I agree with current treatment plan on 01/11/2016, Patient seen face-to-face for psychiatric evaluation follow-up, chart reviewed.  Reviewed the information documented and agree with the treatment plan.  Treatment Plan Summary: Daily contact with patient to assess and evaluate symptoms and progress in treatment and Medication management:   Treatment plan/Summary: 1. Continue crisis management, mood stabilization & relapse prevention.. 2. Continue current medication management to reduce current symptoms to base line and improve the  patient's overall level of functioning: Opioid/Benzodiazepine withdrawal: continue the Librium/Clonidine detox protocols. Agitation/Substance withdrawal symptoms: Continue  Neurontin to 400 mg QID. Insomnia: Discontinue the Trazodone to 100 mg q hs Increase Seroquel 50 mg to 100 mg PO QH'S for insomnia Continue Lidocaine Patch 5% for back and leg pain 3. Treat health problems as indicated; Continue  Albuterol for SOB/Asthma Continue Orajel for tooth pain. 4. Develop treatment plan to enhance medication adherence upon discharge  & prevent the need for  readmission. 5. Psycho-social education regarding relapse prevention and self care. 6. Will continue PRN treatment regimen per protocols. 7.Monitor vital signs, review pertinent findings or order labs when necessary. 8. Social Worker to work on discharge disposition  Oneta Rack, NP 01/11/2016, 8:06 AM  Agree with NP Progress Note,  as above

## 2016-01-11 NOTE — Progress Notes (Signed)
Writer spoke with patient 1:1 and he informed Clinical research associate of the discussion he had with the doctor today concerning his pain medication. He is concerned with what medication the doctor may start him on once his librium is completed. Writer informed patient of the time his pain patches would need to be removed and he reported that he threw the one on his back away in the dayroom trash can. Writer took trash out and went threw it but there was no patch in the trash. Writer is concerned that patient may have given this to another patient on the hall but did not mention this to him. He is focusing more on his pain medications. Support given and safety maintained on unit with 15 min checks.

## 2016-01-11 NOTE — [facility] Group Notes (Signed)
[facility] LCSW Group Therapy  01/11/2016 10:10 to 11:05 AM  Type of Therapy:  Group Therapy  Participation Level:  Active  Participation Quality:  Attentive and Sharing  Affect:  Depressed  Cognitive:  Alert and Oriented  Insight:  Limited  Engagement in Therapy:  Developing/Improving  Modes of Intervention:  Activity, Discussion, Exploration, Rapport Building, Socialization and Support  Summary of Progress/Problems: Topic for today was thoughts and feelings regarding discharge. We discussed fears of upcoming changes including judegements, expectations and stigma of mental health issues. We then discussed supports: what constitutes a supportive framework. As patients processed their anxiety about discharge and described healthy supports patient  chose once again to focus on his pain. Patient was intrusive at time yet responsive to redirection. Patient chose a visual to represent decompensation as feeling overwhelmed as that is when he usually chooses to use.He chose a visual of the end of a tunnel to represent improvement as this is an opportunity for change. Patient was able to answer with honesty when asked if he has ever been at the edge of his tunnel before and shared that thee have been multiple opportunities for change he has walked away from.   Carney Bern, LCSW

## 2016-01-11 NOTE — Progress Notes (Signed)
Patient did not attend the evening speaker AA meeting. Pt was notified that group was beginning but remained in bed.   

## 2016-01-11 NOTE — Progress Notes (Signed)
D: Patient's self inventory sheet: patient has fair (improved) sleep, received Seroquel for sleep medication.good  Appetite, normal energy level, good concentration. Rated depression 5/10, hopeless 6/10, anxiety 6/10. SI/HI/AVH: denies. Physical complaints are diarrhea, cravings, agitation, cramping, irritability along with teeth, hip and shin pain which is chronic. Goal is "to work on my goal plan, began to figure out moves I need to make. Plus keep my anxiety and pain under control". Plans to work on "take meds and begin writing down alternatives to how to reach my goal".   A: Medications administered, assessed medication knowledge and education given on medication regimen. Encouraged increased fluid intake to counteract dehydration from diarrhea.  Emotional support and encouragement given patient. R: Denies SI and HI , contracts for safety. Safety maintained with 15 minute checks.

## 2016-01-12 MED ORDER — GABAPENTIN 400 MG PO CAPS
800.0000 mg | ORAL_CAPSULE | Freq: Three times a day (TID) | ORAL | Status: DC
Start: 2016-01-12 — End: 2016-01-16
  Administered 2016-01-12 – 2016-01-16 (×12): 800 mg via ORAL
  Filled 2016-01-12 (×17): qty 2

## 2016-01-12 MED ORDER — OXYCODONE-ACETAMINOPHEN 5-325 MG PO TABS
1.5000 | ORAL_TABLET | Freq: Three times a day (TID) | ORAL | Status: DC | PRN
  Administered 2016-01-12 – 2016-01-16 (×11): 1.5 via ORAL
  Filled 2016-01-12 (×11): qty 2

## 2016-01-12 MED ORDER — QUETIAPINE FUMARATE 50 MG PO TABS
50.0000 mg | ORAL_TABLET | Freq: Every day | ORAL | Status: DC
  Administered 2016-01-12 – 2016-01-15 (×4): 50 mg via ORAL
  Filled 2016-01-12 (×7): qty 1
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
## 2016-01-12 NOTE — [facility] Group Notes (Signed)
Pt attended spiritual care group on grief and loss facilitated by chaplain Burnis Kingfisher   Group opened with brief discussion and psycho-social ed around grief and loss in relationships and in relation to self - identifying life patterns, circumstances, changes that cause losses. Established group norm of speaking from own life experience. Group goal of establishing open and affirming space for members to share loss and experience with grief, normalize grief experience and provide psycho social education and grief support.    Camryn was present throughout group.  Alert and oriented x4.  Engaged voluntarily with facilitator and other group members.  Leman identified spirituality as important to his recovery.  Identified with the group in grieving identity - stating he is looking for "meaning and purpose"   Kash is worried for his younger sister (10), whom he learned is mis using pain medication.  He feels responsibility for this.    Belva Crome MDiv

## 2016-01-12 NOTE — Progress Notes (Signed)
Recreation Therapy Notes  Date: 07.17.2017 Time: 9:30am Location: [address] Group Room   Group Topic: Stress Management  Goal Area(s) Addresses:  Patient will actively participate in stress management techniques presented during session.   Behavioral Response: Engaged, Attentive   Intervention: Stress management techniques  Activity :  Deep Breathing, Mindfulness and Mindful Breathing. LRT provided education, instruction and demonstration on practice of Deep Breathing, Mindfulness and Mindful Breathing. Patient was asked to participate in technique introduced during session.   Education:  Stress Management, Discharge Planning.   Education Outcome: Acknowledges education  Clinical Observations/Feedback: Patient actively engaged in technique introduced, expressed no concerns and demonstrated ability to practice independently post d/c.   Kennedey Digilio L Khadeeja Elden, LRT/CTRS        Zera Markwardt L 01/12/2016 1:32 PM

## 2016-01-12 NOTE — Plan of Care (Signed)
Problem: Education: Goal: Ability to make informed decisions regarding treatment will improve Outcome: Progressing Nurse discussed suicidal thoughts/ coping skills/ depression with patient.

## 2016-01-12 NOTE — [facility] Group Notes (Signed)
[facility] LCSW Aftercare Discharge Planning Group Note   01/12/2016 11:36 AM  Participation Quality:  Appropriate   Mood/Affect:  Appropriate  Depression Rating:  8  Anxiety Rating:  5  Thoughts of Suicide:  No Will you contract for safety?   NA  Current AVH:  No  Plan for Discharge/Comments:  Pt reports that he is no longer experiencing withdrawals and hopes to speak with the MD about pain management while he is at the hospital. Pt agreeable to follow-up at [facility] in [place] and has appt scheduled for Monday of next week. Pt plans to return home and would also like follow-up with his PCP who currently prescribes his pain medications. Pt reports four hours of sleep last night "That's 3 hours more than I usually get."   Transportation Means: unknown at this time.   Supports: some family supports identified   Counselling psychologist, Conservation officer, nature

## 2016-01-12 NOTE — Progress Notes (Signed)
D:  Patient's self inventory sheet, patient sleeps good, sleep medication is helpful.  Good appetite, low energy level, fair concentration.  Rated depression 7, hopeless 8, anxiety 6.  Withdrawals chilling, cramping, agitation, irritability.  SI, contracts for safety, no plan.  Physical problems, pain, blurred vision, hip, knee, back pain.  Pain medication is not helpful.  Goal is to get out of depression state that hits him and help pain get under control.  Uncertain of plans.  Plans to talk to MD about medications.  No discharge plans. A:  Patient refused librium this morning.  Will talk to MD about stronger pain medication.  Discussed encouragement and support with patient. R:   Denied HI to nurse this morning.  SI, contracts for safety, no plan.  Denied A/V hallucinations.  Safety maintained with 15 minute checks.

## 2016-01-12 NOTE — [facility] Group Notes (Signed)
[facility] LCSW Group Therapy  01/12/2016 4:15 PM  Type of Therapy:  Group Therapy  Participation Level:  Active  Participation Quality:  Attentive  Affect:  Depressed  Cognitive:  Alert and Oriented  Insight:  Improving  Engagement in Therapy:  Improving  Modes of Intervention:  Confrontation, Discussion, Education, Exploration, Problem-solving, Rapport Building, Socialization and Support  Summary of Progress/Problems: Today's Topic: Overcoming Obstacles. Patients identified one short term goal and potential obstacles in reaching this goal. Patients processed barriers involved in overcoming these obstacles. Patients identified steps necessary for overcoming these obstacles and explored motivation (internal and external) for facing these difficulties head on. Dayan was attentive and engaged during today's processing group. He shared that his biggest obstacle is finding treatment that will assist patient with pain management and substance abuse. Matheo shared that "I really want to use my medications appropriately and work on my substance use but I have such horrible pain that I have to be on narcotic pain medication."   Smart, Briscoe Daniello LCSW 01/12/2016, 4:15 PM

## 2016-01-12 NOTE — Progress Notes (Signed)
Patient has been up and active on the unit, he attended AA meeting and was complaint with scheduled meds. He received his pain medication and shortly went to his room to rest. Support given and safety maintained on unit with 15 min check.

## 2016-01-13 DIAGNOSIS — F1390 Sedative, hypnotic, or anxiolytic use, unspecified, uncomplicated: Secondary | ICD-10-CM

## 2016-01-13 MED ORDER — HYDROXYZINE HCL 25 MG PO TABS
25.0000 mg | ORAL_TABLET | Freq: Four times a day (QID) | ORAL | Status: DC | PRN
  Administered 2016-01-13 – 2016-01-16 (×4): 25 mg via ORAL
  Filled 2016-01-13 (×4): qty 1

## 2016-01-13 MED ORDER — BENZOCAINE 10 % MT GEL: Freq: Four times a day (QID) | OROMUCOSAL | Status: DC | PRN

## 2016-01-13 MED ORDER — GABAPENTIN 400 MG PO CAPS: 800.0000 mg | ORAL_CAPSULE | Freq: Three times a day (TID) | ORAL | Status: DC

## 2016-01-13 MED ORDER — NICOTINE 21 MG/24HR TD PT24: 21.0000 mg | MEDICATED_PATCH | Freq: Every day | TRANSDERMAL | Status: AC

## 2016-01-13 MED ORDER — HYDROXYZINE HCL 25 MG PO TABS: 25.0000 mg | ORAL_TABLET | Freq: Four times a day (QID) | ORAL | Status: AC | PRN

## 2016-01-13 MED ORDER — LIDOCAINE 5 % EX PTCH: 2.0000 | MEDICATED_PATCH | CUTANEOUS | Status: AC

## 2016-01-13 MED ORDER — ALBUTEROL SULFATE HFA 108 (90 BASE) MCG/ACT IN AERS: 2.0000 | INHALATION_SPRAY | Freq: Four times a day (QID) | RESPIRATORY_TRACT | Status: AC | PRN

## 2016-01-13 MED ORDER — QUETIAPINE FUMARATE 50 MG PO TABS: 50.0000 mg | ORAL_TABLET | Freq: Every day | ORAL | Status: DC

## 2016-01-13 NOTE — Progress Notes (Signed)
D:  Patient's self inventory sheet, patient sleeps good, sleep medication is helpful.  Good appetite, normal energy level, poor concentration.  Rated depression and hopeless 7, anxiety 6.  Withdrawals, diarrhea, cravings, agitation, runny nose, irritability.  Denied SI, just the feeling of wishing for death sometimes.  Physical problems, headaches, pain.  Worst pain in past 24 hours is #6, teeth, hip, knee, thigh.  Pain medication is helpful.  Goal is to figure out what plans are at discharge and how to get out of depression.  Plans to stay positive and stop depression.  No discharge plans. A:  Medications administered per MD orders.  Emotional support and encouragement given patient. R:  Denied SI while talking to nurse.  SI on self inventory sheet, no plan, contracts for safety.  Denied HI.  Denied A/V hallucinations.  Safety maintained with 15 minute checks.

## 2016-01-13 NOTE — [facility] Group Notes (Signed)
[facility] LCSW Group Therapy  01/13/2016 1:52 PM  Type of Therapy:  Group Therapy  Participation Level:  Active  Participation Quality:  Attentive  Affect:  Appropriate  Cognitive:  Alert and Oriented  Insight:  Improving  Engagement in Therapy:  Improving  Modes of Intervention:  Confrontation, Discussion, Education, Exploration, Problem-solving, Rapport Building, Socialization and Support  Summary of Progress/Problems: [name] came to talk about his personal journey with substance abuse and addiction. The pt processed ways by which to relate to the speaker. [name] provided handouts and educational information pertaining to groups and services offered by the [facility].   Smart, Douglas Sisley LCSW 01/13/2016, 1:52 PM

## 2016-01-13 NOTE — Progress Notes (Signed)
Patient attended wrap-up group and rated his day a 6. Stated that he felt hopeless today. Goal is to take things one day at a time.

## 2016-01-13 NOTE — [facility] Group Notes (Signed)

## 2016-01-13 NOTE — Progress Notes (Signed)
   D: When asked about his day pt stated, "at first it was rough. Today is the first day I've laughed. Been really depressed, but neurontin has helped some. Day has gotten better". Pt was tearful when talking about his younger sis, who's hooked on pain pills. States he feels like he's responsible for her getting on the pills. Pt states he wants long term treatment. Pt has no questions or concerns.    A:  Support and encouragement was offered. 15 min checks continued for safety.  R: Pt remains safe.

## 2016-01-13 NOTE — Progress Notes (Signed)
Recreation Therapy Notes  Animal-Assisted Activity (AAA) Program Checklist/Progress Notes Patient Eligibility Criteria Checklist & Daily Group note for Rec TxIntervention  Date: 07.18.2017 Time: 2:45pm Location: [address] Dayroom    AAA/T Program Assumption of Risk Form signed by Patient/ or Parent Legal Guardian Yes  Patient is free of allergies or sever asthma Yes  Patient reports no fear of animals Yes  Patient reports no history of cruelty to animals Yes  Patient understands his/her participation is voluntary Yes  Behavioral Response: Did not attend.    Carston Riedl L Floy Riegler, LRT/CTRS        Cristen Murcia L 01/13/2016 3:11 PM 
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
## 2016-01-13 NOTE — Plan of Care (Signed)
Problem: Education: Goal: Ability to state activities that reduce stress will improve Outcome: Progressing Nurse discussed anxiety/coping skills with patient.    

## 2016-01-14 MED ORDER — DULOXETINE HCL 20 MG PO CPEP
40.0000 mg | ORAL_CAPSULE | Freq: Every day | ORAL | Status: DC
  Administered 2016-01-14 – 2016-01-16 (×3): 40 mg via ORAL
  Filled 2016-01-14 (×6): qty 2

## 2016-01-14 NOTE — Plan of Care (Signed)
Problem: Medication: Goal: Compliance with prescribed medication regimen will improve Outcome: Progressing Patient is able to take his medications as scheduled.

## 2016-01-14 NOTE — Progress Notes (Signed)
Recreation Therapy Notes  Date: 07.19.2017 Time: 9:30am Location: [address] Group Room   Group Topic: Stress Management  Goal Area(s) Addresses:  Patient will actively participate in stress management techniques presented during session.   Behavioral Response: Engaged, Attentive   Intervention: Stress management techniques  Activity :  Deep Breathing and Guided Imagery. LRT provided education, instruction and demonstration on practice of  Deep Breathing and Guided Imagery. Patient was asked to participate in technique introduced during session.   Education:  Stress Management, Discharge Planning.   Education Outcome: Acknowledges education  Clinical Observations/Feedback: Patient actively engaged in technique introduced, expressed no concerns and demonstrated ability to practice independently post d/c.   Marykay Lex Marvel Sapp, LRT/CTRS        Wiley Flicker L 01/14/2016 3:25 PM

## 2016-01-14 NOTE — Progress Notes (Signed)
Data. Patient denies SI/HI/AVH.   Patient interacting well with staff and other patients. Asking for PRNs in an appropriate manner. Does report chronic pain, but reports pain medications, "help". Action. Emotional support and encouragement offered. Education provided on medication, indications and side effect. Q 15 minute checks done for safety. Response. Safety on the unit maintained through 15 minute checks.  Medications taken as prescribed. Attended groups. Remained calm and appropriate through out shift.

## 2016-01-14 NOTE — Progress Notes (Signed)
Patient ID: Douglas Casey, male   DOB: 01-05-1981, 35 y.o.   MRN: [id number] [facility] MD Progress Note  01/14/2016 11:09 AM Douglas Casey  MRN:  [id number]   Subjective:  Patient reports some ongoing depression and anxiety, but does feel there has been some improvement . He continues to report symptoms of depression, such as a subjective sense of sadness, anhedonia, low energy. Denies suicidal ideations at this time. Continues to report chronic hip pain, and walks with limp, antalgic gait. States " they have told me I am going to need a hip replacement, but they are waiting because I am still young, and they are worried about messing up my sciatic nerve " He does state that opiates are helping alleviate his pain.   Objective: I have discussed case with treatment team and have met with patient . Patient reports some improvement of mood compared to admission, but still feels depressed, sad .  Not tearful today, and more focused on disposition planning options .Denies SI. No disruptive or agitated behaviors on unit, going to some groups, pleasant on approach. As noted, due to severity of pain he was restarted on opiate analgesia, which has been partially helpful. At this time not presenting with any WDL symptoms. No tremors, no diaphoresis, no acute distress and vitals stable .   Principal Problem: Opioid use disorder, severe, dependence ([place])  Diagnosis:   Patient Active Problem List   Diagnosis Date Noted  . Major depressive disorder, recurrent episode ([place]) [F33.9] 01/08/2016  . Opioid use disorder, severe, dependence ([place]) [F11.20] 01/08/2016  . Severe benzodiazepine use disorder [F13.90] 01/08/2016  . MDD (major depressive disorder), recurrent episode, severe ([place]) [F33.2] 12/25/2013  . Drug overdose, intentional ([place]) [T50.902A] 12/24/2013  . Hypertension [I10] 12/24/2013  . Overdose [T50.901A] 12/24/2013   Total Time spent with patient: 20 minutes   Past Psychiatric History: Opioid use  disorder, Benzodiazepine use disorder  Past Medical History:  Past Medical History  Diagnosis Date  . Hypertension   . Asthma   . PONV (postoperative nausea and vomiting)     Past Surgical History  Procedure Laterality Date  . Hip surgery    . Ankle fracture surgery    . Hip pinning Right May 2001  . Ankle closed reduction Right July 2011  . No past surgeries      surgery rt hip, leg and foot   Family History: History reviewed. No pertinent family history.  Family Psychiatric  History: See H&P  Social History:  History  Alcohol Use  . Yes    Comment: " OCCASIONAL " 2-3 beers a month     History  Drug Use  . Yes    Comment: prescribed oxycodone and xanax    Social History   Social History  . Marital Status: Single    Spouse Name: N/A  . Number of Children: N/A  . Years of Education: N/A   Social History Main Topics  . Smoking status: Current Every Day Smoker -- 1.00 packs/day for 20 years    Types: Cigarettes  . Smokeless tobacco: Never Used     Comment: PATIENT DENIES  . Alcohol Use: Yes     Comment: " OCCASIONAL " 2-3 beers a month  . Drug Use: Yes     Comment: prescribed oxycodone and xanax  . Sexual Activity: Yes    Birth Control/ Protection: None   Other Topics Concern  . None   Social History Narrative   Additional Social History:   Sleep:  Fair, due to pain   Appetite:  Improving   Current Medications: Current Facility-Administered Medications  Medication Dose Route Frequency Provider Last Rate Last Dose  . acetaminophen (TYLENOL) tablet 650 mg  650 mg Oral Q6H PRN Encarnacion Slates, NP   650 mg at 01/10/16 0755  . albuterol (PROVENTIL HFA;VENTOLIN HFA) 108 (90 Base) MCG/ACT inhaler 2 puff  2 puff Inhalation Q6H PRN Encarnacion Slates, NP   2 puff at 01/11/16 1548  . alum & mag hydroxide-simeth (MAALOX/MYLANTA) 200-200-20 MG/5ML suspension 30 mL  30 mL Oral Q4H PRN Encarnacion Slates, NP   30 mL at 01/08/16 1509  . benzocaine (ORAJEL) 10 % mucosal gel    Mouth/Throat QID PRN Encarnacion Slates, NP      . DULoxetine (CYMBALTA) DR capsule 40 mg  40 mg Oral Daily Myer Peer Cobos, MD      . gabapentin (NEURONTIN) capsule 800 mg  800 mg Oral TID Jenne Campus, MD   800 mg at 01/14/16 0814  . hydrOXYzine (ATARAX/VISTARIL) tablet 25 mg  25 mg Oral Q6H PRN Laverle Hobby, PA-C   25 mg at 01/13/16 2224  . lidocaine (LIDODERM) 5 % 2 patch  2 patch Transdermal Q24H Derrill Center, NP   1 patch at 01/14/16 1038  . magnesium hydroxide (MILK OF MAGNESIA) suspension 30 mL  30 mL Oral Daily PRN Encarnacion Slates, NP      . multivitamin with minerals tablet 1 tablet  1 tablet Oral Daily Encarnacion Slates, NP   1 tablet at 01/14/16 0814  . nicotine (NICODERM CQ - dosed in mg/24 hours) patch 21 mg  21 mg Transdermal Daily Encarnacion Slates, NP   21 mg at 01/14/16 0814  . oxyCODONE-acetaminophen (PERCOCET/ROXICET) 5-325 MG per tablet 1.5 tablet  1.5 tablet Oral Q8H PRN Jenne Campus, MD   1.5 tablet at 01/14/16 0616  . QUEtiapine (SEROQUEL) tablet 50 mg  50 mg Oral QHS Jenne Campus, MD   50 mg at 01/13/16 2122  . thiamine (VITAMIN B-1) tablet 100 mg  100 mg Oral Daily Encarnacion Slates, NP   100 mg at 01/14/16 5003   Lab Results:  No results found for this or any previous visit (from the past 48 hour(s)). Blood Alcohol level:  Lab Results  Component Value Date   [facility] <11 70/48/8891   Metabolic Disorder Labs: No results found for: HGBA1C, MPG No results found for: PROLACTIN No results found for: CHOL, TRIG, HDL, CHOLHDL, VLDL, LDLCALC  Physical Findings: AIMS: Facial and Oral Movements Muscles of Facial Expression: None, normal Lips and Perioral Area: None, normal Jaw: None, normal Tongue: None, normal,Extremity Movements Upper (arms, wrists, hands, fingers): None, normal Lower (legs, knees, ankles, toes): None, normal, Trunk Movements Neck, shoulders, hips: None, normal, Overall Severity Severity of abnormal movements (highest score from questions above): None,  normal Incapacitation due to abnormal movements: None, normal Patient's awareness of abnormal movements (rate only patient's report): No Awareness, Dental Status Current problems with teeth and/or dentures?: No Does patient usually wear dentures?: No  CIWA:  CIWA-Ar Total: 1 COWS:  COWS Total Score: 1  Musculoskeletal: Strength & Muscle Tone: within normal limits Gait & Station: normal, pain to right hip. limping but steady gait Patient leans: N/A  Psychiatric Specialty Exam: Physical Exam  Nursing note and vitals reviewed. Constitutional: He is oriented to person, place, and time. He appears well-developed.  HENT:  Head: Normocephalic.  Cardiovascular: Normal rate and intact  distal pulses.   Musculoskeletal: Normal range of motion.  Neurological: He is alert and oriented to person, place, and time.  Skin: Skin is warm.  Psychiatric: He has a normal mood and affect. His behavior is normal. Thought content normal.    Review of Systems  Musculoskeletal: Positive for joint pain.       Back and hip pain  Psychiatric/Behavioral: Positive for depression. Negative for suicidal ideas, hallucinations and substance abuse. The patient is nervous/anxious and has insomnia.   All other systems reviewed and are negative.   Blood pressure 150/91, pulse 100, temperature 98.5 F (36.9 C), temperature source Oral, resp. rate 20, height 5' 10.25" (1.784 m), weight 162 lb 4 oz (73.596 kg), SpO2 99 %.Body mass index is 23.12 kg/(m^2).  General Appearance: improved grooming   Eye Contact: improved  Speech: Clear and Coherent  Volume: Normal  Mood: depressed, but some improvement compared to prior   Affect: constricted, does smile briefly at times   Thought Process: Goal Directed  Orientation: Full (Time, Place, and Person)  Thought Content: No delusions,  Denies any hallucinations  Suicidal Thoughts:denies suicidal or self injurious ideations at this time   Homicidal Thoughts:  Denies any violent or homicidal ideations   Memory: Immediate; Good Recent; Good Remote; Good  Judgement: improving   Insight: Present  Psychomotor Activity: normal   Concentration: Concentration: good  and Attention Span: good   Recall: Good  Fund of Knowledge: good   Language: Good  Akathisia: Negative  Handed: Right  AIMS (if indicated):    Assets: Communication Skills Desire for Improvement  ADL's: Intact  Cognition: WNL  Sleep:            Assessment - patient  Presenting with some improvement compared to admission- does remain dysphoric, depressed, ruminative, but at this time denies suicidal ideations, behavior is calm, in good control, and is more future oriented. He struggles with chronic pain, and it was determined to restart opiates due to the severity of his pain- quality of life issues . At this time future oriented, and hoping to be accepted to [facility] and get outpatient treatment at a pain clinic . We discussed medication options to address depression and anxiety further, agrees to Cymbalta trial, states he was on Cymbalta in the past, did not have side effects, states " I did not take it for long, maybe that is why it did not help much "  Treatment Plan Summary: Encourage group participation to work on coping skills and symptom reduction  With rationale reviewed above it is felt that patient does warrant opiate management at this time, due to severity of pain, and now safer as off BZDs  Continue Percocet 7.5 mgrs TID PRN for severe pain  Increase Neurontin to 800 mgrs TID for pain and anxiety  Continue Seroquel to 50 mgrs QHS for night time ruminations, and insomnia Continue Lidocaine Patch 5% for back and leg pain Start Cymbalta 40 mgrs QDAY for depression, anxiety, pain Treatment team/ Social Worker working on discharge disposition  Neita Garnet, MD 01/14/2016, 11:09 AM

## 2016-01-14 NOTE — BHH Group Notes (Signed)
[facility] LCSW Aftercare Discharge Planning Group Note   01/14/2016 9:53 AM  Participation Quality:  Appropriate   Mood/Affect:  Appropriate  Depression Rating:  5-6  Anxiety Rating:  6-7  Thoughts of Suicide:  No Will you contract for safety?   NA  Current AVH:  No  Plan for Discharge/Comments: Pt reports that he met with Clearnce Sorrel from [place] yesterday and plans to d/c to his halfway house. Pt states that Dr. Parke Poisson is helping him find a local pain clinic. Pt plans to follow-up at [facility] for mental health services. "I feel like every day I'm getting better."    Transportation Means: Clearnce Sorrel or bus  Supports: some family supports   Proofreader, Photographer

## 2016-01-14 NOTE — Progress Notes (Signed)
D: Pt was in the dayroom playing cards upon initial approach.  Pt has anxious affect and mood.  Pt reports "things are looking up, getting better, my pain is getting a little easier."  Pt reports his goal is to "try to get out of this hopelessness, today I'm finding solutions, I gotta stick though it, so I accomplished my goal."  Pt denies SI/HI, denies hallucinations, reports leg pain of 5/10.  Pt has been visible in milieu interacting with peers and staff appropriately.  Pt attended evening group.   A: Introduced self to pt.  Met with pt and provided support and encouragement.  Actively listened to pt.  Positive coping skills encouraged and reinforced.  Medications administered per order.  PRN medication administered for anxiety. R: Pt is compliant with medications.  Pt verbally contracts for safety and reports he will inform staff of needs and concerns.  Will continue to monitor and assess.

## 2016-01-14 NOTE — [facility] Group Notes (Signed)
[facility] LCSW Group Therapy  01/14/2016 3:27 PM  Type of Therapy:  Group Therapy  Participation Level:  Active  Participation Quality:  Attentive  Affect:  Appropriate  Cognitive:  Alert and Oriented  Insight:  Improving  Engagement in Therapy:  Improving  Modes of Intervention:  Discussion, Education, Exploration, Problem-solving, Rapport Building, Socialization and Support  Summary of Progress/Problems: Today's Topic: Overcoming Obstacles. Patients identified one short term goal and potential obstacles in reaching this goal. Patients processed barriers involved in overcoming these obstacles. Patients identified steps necessary for overcoming these obstacles and explored motivation (internal and external) for facing these difficulties head on. Gerardo was attentive and engaged during today's processing group. He shared that his obstacle was "my anxiety at starting over in a new place." Izack shared that he is planning to d/c to [facility] in [place], rather than returning to [place]. "I think it will be a positive change for me."   Smart, Janeya Deyo LCSW 01/14/2016, 3:27 PM
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
## 2016-01-15 NOTE — Progress Notes (Signed)
Patient ID: Douglas Casey, male   DOB: 1981-02-14, 35 y.o.   MRN: [id number] Patient ID: Douglas Casey, male   DOB: 31-Aug-1980, 35 y.o.   MRN: [id number] [facility] MD Progress Note  01/15/2016 3:29 PM Douglas Casey  MRN:  [id number]   Subjective:  Douglas Casey reports, I feel very overwhelmed today, scared, concerned, worried. You name any king of anxiety, I have it going on today. I had this Misdemeanor charge for resisting arrest prior to my admission here. My court date was yesterday. Of course I was not at the court yesterday because I am here. Now, I have a warrant for my arrest. The half-way house has accepted me, I have a bed waiting for there. If I get arrested after I get discharged from here, I will lose the bed that I had worked so hard to get. I feel like I'm at loss now. I don't know what to do".  Objective: I have discussed case with treatment team and have met with patient . Patient reports some improvement of mood compared to admission, but still feels depressed, sad, worried, scarred & concerned. This is because he states that he missed a court date scheduled for yesterday for a resisting arrest charge., now, he thinks there is an arrest warrant out there for him after he is discharged from here. He is concerned that he may lose his bed at the half way house where he has been accepted to move in. No disruptive or agitated behaviors on the unit, going to some groups, pleasant on approach. He says he has discussed this matter with the Social worker & the social worker is tearing to contact the court. Support & encourage provided.  Principal Problem: Opioid use disorder, severe, dependence ([place])  Diagnosis:   Patient Active Problem List   Diagnosis Date Noted  . Major depressive disorder, recurrent episode ([place]) [F33.9] 01/08/2016    Priority: High  . Opioid use disorder, severe, dependence ([place]) [F11.20] 01/08/2016    Priority: High  . Severe benzodiazepine use disorder [F13.90] 01/08/2016    Priority:  High  . MDD (major depressive disorder), recurrent episode, severe ([place]) [F33.2] 12/25/2013  . Drug overdose, intentional ([place]) [T50.902A] 12/24/2013  . Hypertension [I10] 12/24/2013  . Overdose [T50.901A] 12/24/2013   Total Time spent with patient: 20 minutes   Past Psychiatric History: Opioid use disorder, Benzodiazepine use disorder  Past Medical History:  Past Medical History  Diagnosis Date  . Hypertension   . Asthma   . PONV (postoperative nausea and vomiting)     Past Surgical History  Procedure Laterality Date  . Hip surgery    . Ankle fracture surgery    . Hip pinning Right May 2001  . Ankle closed reduction Right July 2011  . No past surgeries      surgery rt hip, leg and foot   Family History: History reviewed. No pertinent family history.  Family Psychiatric  History: See H&P  Social History:  History  Alcohol Use  . Yes    Comment: " OCCASIONAL " 2-3 beers a month     History  Drug Use  . Yes    Comment: prescribed oxycodone and xanax    Social History   Social History  . Marital Status: Single    Spouse Name: N/A  . Number of Children: N/A  . Years of Education: N/A   Social History Main Topics  . Smoking status: Current Every Day Smoker -- 1.00 packs/day for 20 years  Types: Cigarettes  . Smokeless tobacco: Never Used     Comment: PATIENT DENIES  . Alcohol Use: Yes     Comment: " OCCASIONAL " 2-3 beers a month  . Drug Use: Yes     Comment: prescribed oxycodone and xanax  . Sexual Activity: Yes    Birth Control/ Protection: None   Other Topics Concern  . None   Social History Narrative   Additional Social History:   Sleep:  Fair, due to pain   Appetite:  Improving   Current Medications: Current Facility-Administered Medications  Medication Dose Route Frequency Provider Last Rate Last Dose  . acetaminophen (TYLENOL) tablet 650 mg  650 mg Oral Q6H PRN Encarnacion Slates, NP   650 mg at 01/10/16 0755  . albuterol (PROVENTIL  HFA;VENTOLIN HFA) 108 (90 Base) MCG/ACT inhaler 2 puff  2 puff Inhalation Q6H PRN Encarnacion Slates, NP   2 puff at 01/14/16 1217  . alum & mag hydroxide-simeth (MAALOX/MYLANTA) 200-200-20 MG/5ML suspension 30 mL  30 mL Oral Q4H PRN Encarnacion Slates, NP   30 mL at 01/08/16 1509  . benzocaine (ORAJEL) 10 % mucosal gel   Mouth/Throat QID PRN Encarnacion Slates, NP      . DULoxetine (CYMBALTA) DR capsule 40 mg  40 mg Oral Daily Jenne Campus, MD   40 mg at 01/15/16 0815  . gabapentin (NEURONTIN) capsule 800 mg  800 mg Oral TID Jenne Campus, MD   800 mg at 01/15/16 1100  . hydrOXYzine (ATARAX/VISTARIL) tablet 25 mg  25 mg Oral Q6H PRN Laverle Hobby, PA-C   25 mg at 01/14/16 1448  . lidocaine (LIDODERM) 5 % 2 patch  2 patch Transdermal Q24H Derrill Center, NP   2 patch at 01/15/16 1059  . magnesium hydroxide (MILK OF MAGNESIA) suspension 30 mL  30 mL Oral Daily PRN Encarnacion Slates, NP      . multivitamin with minerals tablet 1 tablet  1 tablet Oral Daily Encarnacion Slates, NP   1 tablet at 01/15/16 0815  . nicotine (NICODERM CQ - dosed in mg/24 hours) patch 21 mg  21 mg Transdermal Daily Encarnacion Slates, NP   21 mg at 01/15/16 0815  . oxyCODONE-acetaminophen (PERCOCET/ROXICET) 5-325 MG per tablet 1.5 tablet  1.5 tablet Oral Q8H PRN Jenne Campus, MD   1.5 tablet at 01/15/16 0817  . QUEtiapine (SEROQUEL) tablet 50 mg  50 mg Oral QHS Jenne Campus, MD   50 mg at 01/14/16 2317  . thiamine (VITAMIN B-1) tablet 100 mg  100 mg Oral Daily Encarnacion Slates, NP   100 mg at 01/15/16 3299   Lab Results:  No results found for this or any previous visit (from the past 48 hour(s)). Blood Alcohol level:  Lab Results  Component Value Date   [facility] <11 24/26/8341   Metabolic Disorder Labs: No results found for: HGBA1C, MPG No results found for: PROLACTIN No results found for: CHOL, TRIG, HDL, CHOLHDL, VLDL, LDLCALC  Physical Findings: AIMS: Facial and Oral Movements Muscles of Facial Expression: None, normal Lips and  Perioral Area: None, normal Jaw: None, normal Tongue: None, normal,Extremity Movements Upper (arms, wrists, hands, fingers): None, normal Lower (legs, knees, ankles, toes): None, normal, Trunk Movements Neck, shoulders, hips: None, normal, Overall Severity Severity of abnormal movements (highest score from questions above): None, normal Incapacitation due to abnormal movements: None, normal Patient's awareness of abnormal movements (rate only patient's report): No Awareness, Dental Status Current  problems with teeth and/or dentures?: No Does patient usually wear dentures?: No  CIWA:  CIWA-Ar Total: 1 COWS:  COWS Total Score: 3  Musculoskeletal: Strength & Muscle Tone: within normal limits Gait & Station: normal, pain to right hip. limping but steady gait Patient leans: N/A  Psychiatric Specialty Exam: Physical Exam  Nursing note and vitals reviewed. Constitutional: He is oriented to person, place, and time. He appears well-developed.  HENT:  Head: Normocephalic.  Cardiovascular: Normal rate and intact distal pulses.   Musculoskeletal: Normal range of motion.  Neurological: He is alert and oriented to person, place, and time.  Skin: Skin is warm.  Psychiatric: He has a normal mood and affect. His behavior is normal. Thought content normal.    Review of Systems  Musculoskeletal: Positive for joint pain.       Back and hip pain  Psychiatric/Behavioral: Positive for depression. Negative for suicidal ideas, hallucinations and substance abuse. The patient is nervous/anxious and has insomnia.   All other systems reviewed and are negative.   Blood pressure 111/77, pulse 105, temperature 99.1 F (37.3 C), temperature source Oral, resp. rate 18, height 5' 10.25" (1.784 m), weight 73.596 kg (162 lb 4 oz), SpO2 99 %.Body mass index is 23.12 kg/(m^2).  General Appearance: improved grooming   Eye Contact: improved  Speech: Clear and Coherent  Volume: Normal  Mood: depressed, but  some improvement compared to prior   Affect: constricted, does smile briefly at times   Thought Process: Goal Directed  Orientation: Full (Time, Place, and Person)  Thought Content: No delusions,  Denies any hallucinations  Suicidal Thoughts:denies suicidal or self injurious ideations at this time   Homicidal Thoughts: Denies any violent or homicidal ideations   Memory: Immediate; Good Recent; Good Remote; Good  Judgement: improving   Insight: Present  Psychomotor Activity: normal   Concentration: Concentration: good  and Attention Span: good   Recall: Good  Fund of Knowledge: good   Language: Good  Akathisia: Negative  Handed: Right  AIMS (if indicated):    Assets: Communication Skills Desire for Improvement  ADL's: Intact  Cognition: WNL  Sleep:            Assessment - patient  Presenting with some improvement compared to admission- does remain dysphoric, depressed, ruminative, but at this time denies suicidal ideations, behavior is calm, in good control, and is more future oriented. He struggles with chronic pain, and it was determined to restart opiates due to the severity of his pain- quality of life issues . At this time future oriented, and hoping to be accepted to [facility] and get outpatient treatment at a pain clinic . We discussed medication options to address depression and anxiety further, agrees to Cymbalta trial, states he was on Cymbalta in the past, did not have side effects, states " I did not take it for long, maybe that is why it did not help much "  Treatment Plan Summary: Encourage group participation to work on coping skills and symptom reduction  With rationale reviewed above it is felt that patient does warrant opiate management at this time, due to severity of pain, and now safer as off BZDs  Continue Percocet 7.5 mgrs TID PRN for severe pain  Continue Neurontin to 800 mgrs TID for pain and anxiety   Continue Seroquel to 50 mgrs QHS for night time ruminations, and insomnia Continue Lidocaine Patch 5% for back and leg pain Continue Cymbalta 40 mgrs QDAY for depression, anxiety, pain  Treatment team/ Social Worker working on discharge disposition  Encarnacion Slates, NP, PMHNP, FNP-BC 01/15/2016, 3:29 PM  Agree with NP Progress Note, as above

## 2016-01-15 NOTE — Progress Notes (Signed)
Patient ID: Douglas Casey, male   DOB: [birth year], 35 y.o.   MRN: [id number]  DAR: Pt. Denies SI/HI and A/V Hallucinations. He reports sleep is fair, appetite is good, energy level is normal, and concentration is fair. He rates depression 5-6/10, hopelessness 7-8/10, and anxiety 8/10. He reports feeling anxious about discharge but is thankful for the plan set in place. Patient continues to report pain in his right hip. Support and encouragement provided to the patient. Scheduled medications administered to patient per physician's orders. PRN Oxycodone administered to patient for his chronic pain in right hip which is providing relief. Patient is receptive and cooperative. He is seen in the milieu interacting with peers and is attending groups. This evening he came to Clinical research associate and said,"did I get my Cymbalta today?" Writer explained that he did and that it would be administered daily. Patient stated, "I don't like to take antidepressants usually but I am going to try to stick to it." Q15 minute checks are maintained for safety.

## 2016-01-15 NOTE — [facility] Group Notes (Signed)
[facility] LCSW Group Therapy  01/15/2016 12:44 PM  Type of Therapy:  Group Therapy  Participation Level:  Minimal  Participation Quality:  Attentive  Affect:  Depressed and Tearful  Cognitive:  Oriented  Insight:  Improving  Engagement in Therapy:  Improving  Modes of Intervention:  Discussion, Education, Exploration, Problem-solving, Rapport Building, Socialization and Support  Summary of Progress/Problems: Emotion Regulation: This group focused on both positive and negative emotion identification and allowed group members to process ways to identify feelings, regulate negative emotions, and find healthy ways to manage internal/external emotions. Group members were asked to reflect on a time when their reaction to an emotion led to a negative outcome and explored how alternative responses using emotion regulation would have benefited them. Group members were also asked to discuss a time when emotion regulation was utilized when a negative emotion was experienced. Douglas Casey was attentive and engaged during group. He shared that his mood is "all over the place." He is upset about having a warrant out for his arrest due to a missed court date yesterday." He states that he is working on getting into a halfway house from here and plans to "stay in [place] awhile" to start fresh.   Smart, Tyla Burgner LCSW 01/15/2016, 12:44 PM

## 2016-01-15 NOTE — Progress Notes (Signed)
Sergio rates Anxiety 6/10 and Depression 8-9/10. His goal today was "to be more hopeful and get my discharge plan together". Denies SI/HI/AVH. Encouragement and support given. Medications administered as prescribed. Continue Q 15 minute checks for patient safety and medication effectiveness.

## 2016-01-15 NOTE — [facility] Group Notes (Signed)
[facility] Group Notes:  (Nursing/MHT/Case Management/Adjunct)  Date:  01/15/2016  Time:  2:08 PM  Type of Therapy:  Psychoeducational Skills  Participation Level:  Minimal  Participation Quality:  Appropriate  Affect:  Depressed  Cognitive:  Alert and Appropriate  Insight:  Limited  Engagement in Group:  Engaged  Modes of Intervention:  Discussion and Education  Summary of Progress/Problems: Patient came to group and was engaged.   Abby Stines E 01/15/2016, 2:08 PM

## 2016-01-16 ENCOUNTER — Other Ambulatory Visit: Payer: Self-pay

## 2016-01-16 MED ORDER — DULOXETINE HCL 40 MG PO CPEP: 40.0000 mg | ORAL_CAPSULE | Freq: Every day | ORAL | Status: AC

## 2016-01-16 MED ORDER — OXYCODONE-ACETAMINOPHEN 5-325 MG PO TABS: 1.0000 | ORAL_TABLET | Freq: Three times a day (TID) | ORAL | Status: AC | PRN

## 2016-01-16 MED ORDER — QUETIAPINE FUMARATE 50 MG PO TABS: 50.0000 mg | ORAL_TABLET | Freq: Every day | ORAL | Status: AC

## 2016-01-16 MED ORDER — HYDROXYZINE HCL 25 MG PO TABS: 25.0000 mg | ORAL_TABLET | Freq: Four times a day (QID) | ORAL | Status: AC | PRN

## 2016-01-16 MED ORDER — LORAZEPAM 1 MG PO TABS
ORAL_TABLET | ORAL | Status: AC
  Administered 2016-01-16: 12:00:00
  Filled 2016-01-16: qty 1

## 2016-01-16 MED ORDER — LORAZEPAM 1 MG PO TABS
1.0000 mg | ORAL_TABLET | Freq: Once | ORAL | Status: AC
  Administered 2016-01-16: 1 mg via ORAL

## 2016-01-16 MED ORDER — GABAPENTIN 400 MG PO CAPS: 800.0000 mg | ORAL_CAPSULE | Freq: Three times a day (TID) | ORAL | Status: AC
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
## 2016-01-16 NOTE — Progress Notes (Signed)
D    Pt reports feeling a little less anxious than yesterday but continues to feel anxious and depressed    Pt reports he enjoyed karaoke group    He is treatment complaint and interacts minimally but appropriately with others A    Verbal support given   Medications administered and effectiveness monitored   Q 15 min checks R   Pt is safe and receptive to verbal support

## 2016-01-16 NOTE — Progress Notes (Signed)
Data. Patient denies SI/HI/AVH.   Patient interacting well with staff and other patients. Reports he slept, "very well". Reports looking forward to, and being a little worried, about going home today. Action. Emotional support and encouragement offered. Education provided on medication, indications and side effect. Q 15 minute checks done for safety. Response. Safety on the unit maintained through 15 minute checks.  Medications taken as prescribed. Attended groups. Remained calm and appropriate through out shift.  Pt. discharged to lobby, friend to drive him home. Belongings sheet reviewed and signed by pt. and all belongings sent home. Scripts sent with patient, including personal inhaler. Paperwork reviewed and pt. able to verbalize understanding of education. Pt. in no current distress and ambulatory. Denies SI/HI/AVH.
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
## 2016-01-16 NOTE — Progress Notes (Signed)
  [facility] Adult Case Management Discharge Plan :  Will you be returning to the same living situation after discharge:  No.Pt accepted to [facility].  At discharge, do you have transportation home?: Yes,  Douglas Casey (owner of halfway house) will pick up pt at 2pm. Do you have the ability to pay for your medications: Yes,  Medicare and Medicaid  Release of information consent forms completed and submitted to medical records by CSW.  Patient to Follow up at: Follow-up Information    Follow up with [facility].   Why:  Several attempts made to schedule follow-up appt. Please call office at discharge to follow-up for pain management until he is able to refer you to pain clinic in [place].    Contact information:   ATTN: Dr. Mayford Knife [address] [place], [place] [address] Phone: [phone] Fax: [phone]      Follow up with [name], MD. Go on 02/17/2016.   Specialty:  Family Medicine   Why:  APPOINTMENT AT 10AM FOR PRIMARY CARE PHYSICIAN; PLEASE ARRIVE 15 MINS EARLY TO COMPLETE PAPERWORK.    Contact information:   [address] [place] Flake [place] [address] [phone]       Follow up with [facility].   Why:  Walk in between 8am-9am Monday through Friday for hospital follow-up/medication management/assessment for counseling services.    Contact information:   [address] [place], [place] [address] Phone: [phone] Fax: [phone]      Next level of care provider has access to [facility] Link:no  Safety Planning and Suicide Prevention discussed: Yes,  [name] completed with pt; pt declined to consent to family contact. SPI pamphlet and mobile crisis information provided to pt and he was encouraged to share information with support network.   Have you used any form of tobacco in the last 30 days? (Cigarettes, Smokeless Tobacco, Cigars, and/or Pipes): Yes  Has patient been referred to the Quitline?: Patient refused referral  Patient has been referred  for addiction treatment: Yes  Smart, Douglas Giangregorio LCSW 01/16/2016, 10:04 AM

## 2016-01-21 NOTE — Discharge Summary (Signed)
Physician Discharge Summary Note  Patient:  Douglas Casey is an 35 y.o., male MRN:  [id number] DOB:  1981-02-19 Patient phone:  [phone] (home)  Patient address:   [address] [place] [address],  Total Time spent with patient: 30 minutes  Date of Admission:  01/08/2016 Date of Discharge: 01/16/16  Reason for Admission:  Substance abuse  Principal Problem: Opioid use disorder, severe, dependence [facility]) Discharge Diagnoses: Patient Active Problem List   Diagnosis Date Noted  . Major depressive disorder, recurrent episode (HCC) [F33.9] 01/08/2016  . Opioid use disorder, severe, dependence (HCC) [F11.20] 01/08/2016  . Severe benzodiazepine use disorder [F13.90] 01/08/2016  . MDD (major depressive disorder), recurrent episode, severe (HCC) [F33.2] 12/25/2013  . Drug overdose, intentional (HCC) [T50.902A] 12/24/2013  . Hypertension [I10] 12/24/2013  . Overdose [T50.901A] 12/24/2013    Past Psychiatric History: see HPI  Past Medical History:  Past Medical History:  Diagnosis Date  . Asthma   . Hypertension   . PONV (postoperative nausea and vomiting)     Past Surgical History:  Procedure Laterality Date  . ANKLE CLOSED REDUCTION Right July 2011  . ANKLE FRACTURE SURGERY    . HIP PINNING Right May 2001  . HIP SURGERY    . NO PAST SURGERIES     surgery rt hip, leg and foot   Family History: History reviewed. No pertinent family history. Family Psychiatric  History: see HPI Social History:  History  Alcohol Use  . Yes    Comment: " OCCASIONAL " 2-3 beers a month     History  Drug Use    Comment: prescribed oxycodone and xanax    Social History   Social History  . Marital status: Single    Spouse name: N/A  . Number of children: N/A  . Years of education: N/A   Social History Main Topics  . Smoking status: Current Every Day Smoker    Packs/day: 1.00    Years: 20.00    Types: Cigarettes  . Smokeless tobacco: Never Used     Comment: PATIENT DENIES   . Alcohol use Yes     Comment: " OCCASIONAL " 2-3 beers a month  . Drug use:      Comment: prescribed oxycodone and xanax  . Sexual activity: Yes    Birth control/ protection: None   Other Topics Concern  . None   Social History Narrative  . None    Hospital Course:    Douglas Casey was admitted for Opioid use disorder, severe, dependence (HCC) and crisis management.  She was treated with DULoxetine 40 MG depression, gabapentin 800 TID anxiety, QUEtiapine 50 mg agitation/mood control.  Medical problems were identified and treated as needed.  Home medications were restarted as appropriate.  Improvement was monitored by observation and Douglas Casey daily report of symptom reduction.  Emotional and mental status was monitored by daily self inventory reports completed by Douglas Casey and clinical staff.  Patient reported continued improvement, denied any new concerns.  Patient had been compliant on medications and denied side effects.  Support and encouragement was provided.    At time of discharge, patient rated both depression and anxiety levels to be manageable and minimal.  Patient encouraged to attend groups to help with recognizing triggers of emotional crises and de-stabilizations.  Patient encouraged to attend group to help identify the positive things in life that would help in dealing with feelings of loss, depression and unhealthy or abusive tendencies.  Douglas Casey was evaluated by the treatment team for stability and plans for continued recovery upon discharge.  He was offered further treatment options upon discharge including Residential, Intensive Outpatient and Outpatient treatment. He will follow up with agency listed below for medication management and counseling.  Encouraged patient to maintain satisfactory support network and home environment.  Advised to adhere to medication compliance and outpatient treatment follow up.  Prescriptions provided.       Douglas Casey motivation  was an integral factor for scheduling further treatment.  Employment, transportation, bed availability, health status, family support, and any pending legal issues were also considered during his hospital stay.  Upon completion of this admission the patient was both mentally and medically stable for discharge denying suicidal/homicidal ideation, auditory/visual/tactile hallucinations, delusional thoughts and paranoia.      Physical Findings: AIMS: Facial and Oral Movements Muscles of Facial Expression: None, normal Lips and Perioral Area: None, normal Jaw: None, normal Tongue: None, normal,Extremity Movements Upper (arms, wrists, hands, fingers): None, normal Lower (legs, knees, ankles, toes): None, normal, Trunk Movements Neck, shoulders, hips: None, normal, Overall Severity Severity of abnormal movements (highest score from questions above): None, normal Incapacitation due to abnormal movements: None, normal Patient's awareness of abnormal movements (rate only patient's report): No Awareness, Dental Status Current problems with teeth and/or dentures?: No Does patient usually wear dentures?: No  CIWA:  CIWA-Ar Total: 1 COWS:  COWS Total Score: 5  Musculoskeletal: Strength & Muscle Tone: within normal limits Gait & Station: normal Patient leans: N/A  Psychiatric Specialty Exam:  See MD SRA Physical Exam  Nursing note and vitals reviewed. Psychiatric: He has a normal mood and affect. His speech is normal and behavior is normal. Judgment and thought content normal. Cognition and memory are normal.    Review of Systems  Constitutional: Negative.   HENT: Negative.   Eyes: Negative.   Respiratory: Negative.   Cardiovascular: Negative.   Gastrointestinal: Negative.   Genitourinary: Negative.   Musculoskeletal: Negative.   Skin: Negative.   Neurological: Negative.   Endo/Heme/Allergies: Negative.   Psychiatric/Behavioral: Negative.     Blood pressure 118/81, pulse 88, temperature  98.2 F (36.8 C), temperature source Oral, resp. rate 16, height 5' 10.25" (1.784 m), weight 73.6 kg (162 lb 4 oz), SpO2 99 %.Body mass index is 23.12 kg/m.   Have you used any form of tobacco in the last 30 days? (Cigarettes, Smokeless Tobacco, Cigars, and/or Pipes): Yes  Has this patient used any form of tobacco in the last 30 days? (Cigarettes, Smokeless Tobacco, Cigars, and/or Pipes) Yes, N/A  Blood Alcohol level:  Lab Results  Component Value Date   ETH <11 12/23/2013    Metabolic Disorder Labs:  No results found for: HGBA1C, MPG No results found for: PROLACTIN No results found for: CHOL, TRIG, HDL, CHOLHDL, VLDL, LDLCALC  See Psychiatric Specialty Exam and Suicide Risk Assessment completed by Attending Physician prior to discharge.  Discharge destination:  Home  Is patient on multiple antipsychotic therapies at discharge:  No   Has Patient had three or more failed trials of antipsychotic monotherapy by history:  No  Recommended Plan for Multiple Antipsychotic Therapies: NA     Medication List    STOP taking these medications   ALPRAZolam 1 MG tablet Commonly known as:  XANAX   cyclobenzaprine 10 MG tablet Commonly known as:  FLEXERIL   gabapentin 800 MG tablet Commonly known as:  NEURONTIN Replaced by:  gabapentin 400 MG capsule   ibuprofen 200 MG  tablet Commonly known as:  ADVIL,MOTRIN   Oxycodone HCl 10 MG Tabs   PRESCRIPTION MEDICATION     TAKE these medications     Indication  albuterol 108 (90 Base) MCG/ACT inhaler Commonly known as:  PROVENTIL HFA;VENTOLIN HFA Inhale 2 puffs into the lungs every 6 (six) hours as needed for wheezing or shortness of breath.  Indication:  Asthma   DULoxetine HCl 40 MG Cpep Take 40 mg by mouth daily.  Indication:  Major Depressive Disorder   gabapentin 400 MG capsule Commonly known as:  NEURONTIN Take 2 capsules (800 mg total) by mouth 3 (three) times daily. For agitation Replaces:  gabapentin 800 MG tablet   Indication:  Agitation   hydrOXYzine 25 MG tablet Commonly known as:  ATARAX/VISTARIL Take 1 tablet (25 mg total) by mouth every 6 (six) hours as needed for anxiety.  Indication:  Anxiety   hydrOXYzine 25 MG tablet Commonly known as:  ATARAX/VISTARIL Take 1 tablet (25 mg total) by mouth every 6 (six) hours as needed for anxiety.  Indication:  Anxiety Neurosis   lidocaine 5 % Commonly known as:  LIDODERM Place 2 patches onto the skin daily. Remove & Discard patch within 12 hours or as directed by MD: For pain management  Indication:  Pain management   nicotine 21 mg/24hr patch Commonly known as:  NICODERM CQ - dosed in mg/24 hours Place 1 patch (21 mg total) onto the skin daily. For nicotine addiction  Indication:  Nicotine Addiction   oxyCODONE-acetaminophen 5-325 MG tablet Commonly known as:  PERCOCET/ROXICET Take 1 tablet by mouth every 8 (eight) hours as needed for severe pain.  Indication:  Pain   QUEtiapine 50 MG tablet Commonly known as:  SEROQUEL Take 1 tablet (50 mg total) by mouth at bedtime. For agitation/mood control  Indication:  Mood control      [facility]   Why:  Several attempts made to schedule follow-up appt. Please call office at discharge to follow-up for pain management until he is able to refer you to pain clinic in [place].  Contact information: ATTN: Dr. Mayford Knife [address] [place], [place] [address] Phone: [phone] Fax: [phone]       [name], MD. Go on 02/17/2016.   Specialty:  Family Medicine Why:  APPOINTMENT AT 10AM FOR PRIMARY CARE PHYSICIAN; PLEASE ARRIVE 15 MINS EARLY TO COMPLETE PAPERWORK.  Contact information: [address] [place] [address] [phone]        [facility] Mixer.   Why:  Walk in between 8am-9am Monday through Friday for hospital follow-up/medication management/assessment for counseling services.  Contact information: [address] [place]  [address] Phone: [phone] Fax: [phone]          Follow-up recommendations:  Activity:  as tol Diet:  as tol  Comments:  1.  Take all your medications as prescribed.   2.  Report any adverse side effects to outpatient provider. 3.  Patient instructed to not use alcohol or illegal drugs while on prescription medicines. 4.  In the event of worsening symptoms, instructed patient to call 911, the crisis hotline or go to nearest emergency room for evaluation of symptoms.  Signed: Lindwood Qua, NP [facility] 01/21/2016, 9:41 AM  Patient seen, Suicide Assessment Completed.  Disposition Plan Reviewed

## 2016-01-25 ENCOUNTER — Emergency Department (HOSPITAL_COMMUNITY)
Admission: EM | Admit: 2016-01-25 | Discharge: 2016-01-25 | Disposition: A | Discharge status: Home / Self Care | Payer: Medicare Other | Attending: Emergency Medicine | Admitting: Emergency Medicine

## 2016-01-25 ENCOUNTER — Emergency Department (HOSPITAL_COMMUNITY): Payer: Medicare Other

## 2016-01-25 ENCOUNTER — Encounter (HOSPITAL_COMMUNITY): Payer: Self-pay | Admitting: Emergency Medicine

## 2016-01-25 DIAGNOSIS — M25551 Pain in right hip: Secondary | ICD-10-CM | POA: Diagnosis not present

## 2016-01-25 DIAGNOSIS — J45909 Unspecified asthma, uncomplicated: Secondary | ICD-10-CM | POA: Diagnosis not present

## 2016-01-25 DIAGNOSIS — F17210 Nicotine dependence, cigarettes, uncomplicated: Secondary | ICD-10-CM | POA: Insufficient documentation

## 2016-01-25 DIAGNOSIS — Z79899 Other long term (current) drug therapy: Secondary | ICD-10-CM | POA: Diagnosis not present

## 2016-01-25 DIAGNOSIS — I10 Essential (primary) hypertension: Secondary | ICD-10-CM | POA: Diagnosis not present

## 2016-01-25 HISTORY — DX: Other bilateral secondary osteoarthritis of hip: M16.6

## 2016-01-25 HISTORY — DX: Idiopathic aseptic necrosis of right femur: M87.051

## 2016-01-25 MED ORDER — KETOROLAC TROMETHAMINE 60 MG/2ML IM SOLN
60.0000 mg | Freq: Once | INTRAMUSCULAR | Status: AC
  Administered 2016-01-25: 60 mg via INTRAMUSCULAR
  Filled 2016-01-25: qty 2

## 2016-01-25 MED ORDER — NAPROXEN 500 MG PO TABS: 500.0000 mg | ORAL_TABLET | Freq: Two times a day (BID) | ORAL | 0 refills | Status: AC

## 2016-01-25 MED ORDER — OXYCODONE-ACETAMINOPHEN 5-325 MG PO TABS
1.0000 | ORAL_TABLET | Freq: Once | ORAL | Status: AC
  Administered 2016-01-25: 1 via ORAL
  Filled 2016-01-25: qty 1

## 2016-01-25 MED ORDER — METHOCARBAMOL 500 MG PO TABS
1000.0000 mg | ORAL_TABLET | Freq: Once | ORAL | Status: AC
  Administered 2016-01-25: 1000 mg via ORAL
  Filled 2016-01-25: qty 2

## 2016-01-25 MED ORDER — CYCLOBENZAPRINE HCL 10 MG PO TABS: 10.0000 mg | ORAL_TABLET | Freq: Two times a day (BID) | ORAL | 0 refills | Status: AC | PRN

## 2016-01-25 NOTE — Discharge Instructions (Signed)
You have been seen today for hip pain. Your imaging showed no acute abnormalities. Follow up with orthopedics as soon as possible. Call the number provided to set up an appointment. Expect your soreness to increase over the next 2-3 days. Take it easy, but do not lay around too much as this may make the stiffness worse. Take 500 mg of naproxen every 12 hours or 800 mg of ibuprofen every 8 hours for the next 3 days. Take these medications with food to avoid upset stomach. Flexeril is a muscle relaxer and may help loosen stiff muscles. Do not take the Flexeril while driving or performing other dangerous activities.

## 2016-01-25 NOTE — ED Provider Notes (Signed)
[facility]-EMERGENCY DEPT Provider Note   CSN: [id number] Arrival date & time: 01/25/16  1356  First Provider Contact:   First MD Initiated Contact with Patient 01/25/16 1509        By signing my name below, I, Douglas Casey, attest that this documentation has been prepared under the direction and in the presence of Douglas Joy, PA-C. Electronically Signed: Doreatha Casey, ED Scribe. 01/25/16. 3:15 PM.   History   Chief Complaint Chief Complaint  Patient presents with  . leg locking up  . Hip Pain    HPI Douglas Casey is a 35 y.o. male with h/o avascular necrosis of right femoral head, osteoarthritis of bilateral hips who presents to the Emergency Department complaining of moderate, acute on chronic right hip pain onset yesterday and worsened today. Pt states he was doing extensive yard work yesterday, but denies specific recent injury, trauma or falls. Pt states that pain is worsened with movement, ambulation and weight-bearing. He is ambulatory, but with pain. Pt denies taking OTC medications at home to improve symptoms. He denies weakness, numbness, additional complaints. Pt is followed by [facility] for his AVN, but has not been seen in 1.5 years for his chronic pain. Per record review, pt had an appointment with Dr. Rolley Sims at [facility] for 10/29/15 and he was a no show.   The history is provided by the patient. No language interpreter was used.    Past Medical History:  Diagnosis Date  . Asthma   . Avascular necrosis of right femoral head (HCC)   . Hypertension   . Other bilateral secondary osteoarthritis of hip   . PONV (postoperative nausea and vomiting)     Patient Active Problem List   Diagnosis Date Noted  . Major depressive disorder, recurrent episode (HCC) 01/08/2016  . Opioid use disorder, severe, dependence (HCC) 01/08/2016  . Severe benzodiazepine use disorder 01/08/2016  . MDD (major depressive disorder), recurrent episode, severe (HCC) 12/25/2013  . Drug  overdose, intentional (HCC) 12/24/2013  . Hypertension 12/24/2013  . Overdose 12/24/2013    Past Surgical History:  Procedure Laterality Date  . ANKLE CLOSED REDUCTION Right July 2011  . ANKLE FRACTURE SURGERY    . HIP PINNING Right May 2001  . HIP SURGERY    . NO PAST SURGERIES     surgery rt hip, leg and foot       Home Medications    Prior to Admission medications   Medication Sig Start Date End Date Taking? Authorizing Provider  albuterol (PROVENTIL HFA;VENTOLIN HFA) 108 (90 Base) MCG/ACT inhaler Inhale 2 puffs into the lungs every 6 (six) hours as needed for wheezing or shortness of breath. 01/13/16   [name], NP  cyclobenzaprine (FLEXERIL) 10 MG tablet Take 1 tablet (10 mg total) by mouth 2 (two) times daily as needed for muscle spasms. 01/25/16   Douglas C Joy, PA-C  DULoxetine 40 MG CPEP Take 40 mg by mouth daily. 01/16/16   [name], NP  gabapentin (NEURONTIN) 400 MG capsule Take 2 capsules (800 mg total) by mouth 3 (three) times daily. For agitation 01/16/16   [name], NP  hydrOXYzine (ATARAX/VISTARIL) 25 MG tablet Take 1 tablet (25 mg total) by mouth every 6 (six) hours as needed for anxiety. 01/13/16   [name], NP  hydrOXYzine (ATARAX/VISTARIL) 25 MG tablet Take 1 tablet (25 mg total) by mouth every 6 (six) hours as needed for anxiety. 01/16/16   [name], NP  lidocaine (LIDODERM) 5 %  Place 2 patches onto the skin daily. Remove & Discard patch within 12 hours or as directed by MD: For pain management 01/13/16   [name], NP  naproxen (NAPROSYN) 500 MG tablet Take 1 tablet (500 mg total) by mouth 2 (two) times daily. 01/25/16   Douglas C Joy, PA-C  nicotine (NICODERM CQ - DOSED IN MG/24 HOURS) 21 mg/24hr patch Place 1 patch (21 mg total) onto the skin daily. For nicotine addiction 01/13/16   [name], NP  oxyCODONE-acetaminophen (PERCOCET/ROXICET) 5-325 MG tablet Take 1 tablet by mouth every 8 (eight) hours as needed for severe pain. 01/16/16    [name], NP  QUEtiapine (SEROQUEL) 50 MG tablet Take 1 tablet (50 mg total) by mouth at bedtime. For agitation/mood control 01/16/16   [name], NP    Family History No family history on file.  Social History Social History  Substance Use Topics  . Smoking status: Current Every Day Smoker    Packs/day: 1.00    Years: 20.00    Types: Cigarettes  . Smokeless tobacco: Never Used     Comment: PATIENT DENIES  . Alcohol use Yes     Comment: " OCCASIONAL " 2-3 beers a month     Allergies   Bee venom and Shellfish allergy   Review of Systems Review of Systems  Constitutional: Negative for chills and fever.  Musculoskeletal: Positive for arthralgias (right hip).  Neurological: Negative for weakness and numbness.     Physical Exam Updated Vital Signs BP 130/92 (BP Location: Left Arm)   Pulse 89   Temp 98.1 F (36.7 C) (Oral)   Resp 18   Ht 5\' 10"  (1.778 m)   Wt 170 lb (77.1 kg)   SpO2 99%   BMI 24.39 kg/m   Physical Exam  Constitutional: He appears well-developed and well-nourished. No distress.  HENT:  Head: Normocephalic and atraumatic.  Eyes: Conjunctivae are normal.  Neck: Neck supple.  Cardiovascular: Normal rate and regular rhythm.   Pulmonary/Chest: Effort normal. No respiratory distress.  Abdominal: He exhibits no distension.  Musculoskeletal: Normal range of motion. He exhibits tenderness.  Full but painful ROM of the right hip. FROM in the right knee. No sensory deficits. Strength 5/5. Tenderness with lateral palpation of the right hip.   Neurological: He is alert.  Skin: Skin is warm and dry. He is not diaphoretic.  Psychiatric: He has a normal mood and affect. His behavior is normal.  Nursing note and vitals reviewed.    ED Treatments / Results  Labs (all labs ordered are listed, but only abnormal results are displayed) Labs Reviewed - No data to display  EKG  EKG Interpretation None       Radiology Dg Hip Unilat W Or Wo  Pelvis 2-3 Views Right  Result Date: 01/25/2016 CLINICAL DATA:  Chronic right hip pain.  Increased pain today. EXAM: DG HIP (WITH OR WITHOUT PELVIS) 2-3V RIGHT COMPARISON:  None. FINDINGS: The right hip is chronically subluxed superiorly. ORIF of the posterior acetabulum is noted. There is chronic avascular necrosis of the right femoral head. No acute fracture is present. The pelvis is otherwise intact. IMPRESSION: 1. Marked sclerotic changes and superior subluxation of the right hip with probable pseudoarticulation along the superior aspect of the acetabulum following ORIF. 2. Avascular necrosis of the right femoral head 3. No acute fracture. Electronically Signed   By: 01/27/2016 M.D.   On: 01/25/2016 15:36   Procedures Procedures (including critical care time)  DIAGNOSTIC  STUDIES: Oxygen Saturation is 99% on RA, normal by my interpretation.    COORDINATION OF CARE: 3:12 PM Discussed treatment plan with pt at bedside which includes XR, orthopedic f/u and pt agreed to plan.    Medications Ordered in ED Medications  ketorolac (TORADOL) injection 60 mg (60 mg Intramuscular Given 01/25/16 1618)  oxyCODONE-acetaminophen (PERCOCET/ROXICET) 5-325 MG per tablet 1 tablet (1 tablet Oral Given 01/25/16 1617)  methocarbamol (ROBAXIN) tablet 1,000 mg (1,000 mg Oral Given 01/25/16 1618)     Initial Impression / Assessment and Plan / ED Course  I have reviewed the triage vital signs and the nursing notes.  Pertinent labs & imaging results that were available during my care of the patient were reviewed by me and considered in my medical decision making (see chart for details).  Clinical Course    Callum Wolf presents with acute on chronic right hip pain.  No acute abnormalities on x-ray. Patient is able to bear weight. Orthopedic follow-up. Resources given. The patient was given instructions for home care as well as return precautions. Patient voices understanding of these instructions,  accepts the plan, and is comfortable with discharge.  Vitals:   01/25/16 1407 01/25/16 1623  BP: 130/92   Pulse: 89 71  Resp: 18   Temp: 98.1 F (36.7 C)   TempSrc: Oral   SpO2: 99% 100%  Weight: 77.1 kg   Height: 5\' 10"  (1.778 m)      Final Clinical Impressions(s) / ED Diagnoses   Final diagnoses:  Right hip pain    New Prescriptions Discharge Medication List as of 01/25/2016  4:02 PM    START taking these medications   Details  cyclobenzaprine (FLEXERIL) 10 MG tablet Take 1 tablet (10 mg total) by mouth 2 (two) times daily as needed for muscle spasms., Starting Sun 01/25/2016, Print    naproxen (NAPROSYN) 500 MG tablet Take 1 tablet (500 mg total) by mouth 2 (two) times daily., Starting Sun 01/25/2016, Print        I personally performed the services described in this documentation, which was scribed in my presence. The recorded information has been reviewed and is accurate.    01/27/2016, PA-C 01/25/16 1709    01/27/16, MD 01/26/16 [id number]

## 2016-01-25 NOTE — ED Triage Notes (Signed)
Patient states that he has chronic right hip pain from injuries. Douglas Casey was helping friend cut done trees and when got up today having right hip pain radiating down right leg and causing right leg "to lock up on me:.

## 2016-01-29 DIAGNOSIS — E1165 Type 2 diabetes mellitus with hyperglycemia: Secondary | ICD-10-CM | POA: Diagnosis not present

## 2016-01-29 DIAGNOSIS — H9202 Otalgia, left ear: Secondary | ICD-10-CM | POA: Diagnosis not present

## 2016-01-29 DIAGNOSIS — Z13220 Encounter for screening for lipoid disorders: Secondary | ICD-10-CM | POA: Diagnosis not present

## 2016-01-29 DIAGNOSIS — Z716 Tobacco abuse counseling: Secondary | ICD-10-CM | POA: Diagnosis not present

## 2016-01-29 DIAGNOSIS — R5383 Other fatigue: Secondary | ICD-10-CM | POA: Diagnosis not present

## 2016-01-29 DIAGNOSIS — Z0000 Encounter for general adult medical examination without abnormal findings: Secondary | ICD-10-CM | POA: Diagnosis not present

## 2016-01-29 DIAGNOSIS — R0602 Shortness of breath: Secondary | ICD-10-CM | POA: Diagnosis not present

## 2016-01-29 DIAGNOSIS — Z1389 Encounter for screening for other disorder: Secondary | ICD-10-CM | POA: Diagnosis not present

## 2016-01-29 DIAGNOSIS — Z79899 Other long term (current) drug therapy: Secondary | ICD-10-CM | POA: Diagnosis not present

## 2016-01-29 DIAGNOSIS — G894 Chronic pain syndrome: Secondary | ICD-10-CM | POA: Diagnosis not present

## 2016-01-29 DIAGNOSIS — E559 Vitamin D deficiency, unspecified: Secondary | ICD-10-CM | POA: Diagnosis not present

## 2016-02-02 ENCOUNTER — Other Ambulatory Visit: Payer: Self-pay | Admitting: Family Medicine

## 2016-02-12 DIAGNOSIS — F419 Anxiety disorder, unspecified: Secondary | ICD-10-CM | POA: Diagnosis not present

## 2016-02-12 DIAGNOSIS — E559 Vitamin D deficiency, unspecified: Secondary | ICD-10-CM | POA: Diagnosis not present

## 2016-02-12 DIAGNOSIS — Z79899 Other long term (current) drug therapy: Secondary | ICD-10-CM | POA: Diagnosis not present

## 2016-02-12 DIAGNOSIS — F329 Major depressive disorder, single episode, unspecified: Secondary | ICD-10-CM | POA: Diagnosis not present

## 2016-02-12 DIAGNOSIS — R0602 Shortness of breath: Secondary | ICD-10-CM | POA: Diagnosis not present

## 2016-02-12 DIAGNOSIS — Z760 Encounter for issue of repeat prescription: Secondary | ICD-10-CM | POA: Diagnosis not present

## 2016-02-12 DIAGNOSIS — M5431 Sciatica, right side: Secondary | ICD-10-CM | POA: Diagnosis not present

## 2016-02-12 DIAGNOSIS — Z87891 Personal history of nicotine dependence: Secondary | ICD-10-CM | POA: Diagnosis not present

## 2016-02-12 DIAGNOSIS — R5383 Other fatigue: Secondary | ICD-10-CM | POA: Diagnosis not present

## 2016-02-12 DIAGNOSIS — F410 Panic disorder [episodic paroxysmal anxiety] without agoraphobia: Secondary | ICD-10-CM | POA: Diagnosis not present

## 2016-02-12 DIAGNOSIS — G894 Chronic pain syndrome: Secondary | ICD-10-CM | POA: Diagnosis not present

## 2016-02-17 ENCOUNTER — Ambulatory Visit: Payer: Self-pay | Admitting: Family Medicine

## 2016-02-17 DIAGNOSIS — Z0289 Encounter for other administrative examinations: Secondary | ICD-10-CM

## 2016-02-26 DIAGNOSIS — Z79899 Other long term (current) drug therapy: Secondary | ICD-10-CM | POA: Diagnosis not present

## 2016-02-26 DIAGNOSIS — F329 Major depressive disorder, single episode, unspecified: Secondary | ICD-10-CM | POA: Diagnosis not present

## 2016-02-26 DIAGNOSIS — G894 Chronic pain syndrome: Secondary | ICD-10-CM | POA: Diagnosis not present

## 2016-02-26 DIAGNOSIS — F419 Anxiety disorder, unspecified: Secondary | ICD-10-CM | POA: Diagnosis not present

## 2016-02-28 DIAGNOSIS — F329 Major depressive disorder, single episode, unspecified: Secondary | ICD-10-CM | POA: Diagnosis not present

## 2016-02-28 DIAGNOSIS — T426X1A Poisoning by other antiepileptic and sedative-hypnotic drugs, accidental (unintentional), initial encounter: Secondary | ICD-10-CM | POA: Diagnosis not present

## 2016-02-28 DIAGNOSIS — T50904A Poisoning by unspecified drugs, medicaments and biological substances, undetermined, initial encounter: Secondary | ICD-10-CM | POA: Diagnosis not present

## 2016-02-28 DIAGNOSIS — G8929 Other chronic pain: Secondary | ICD-10-CM | POA: Diagnosis not present

## 2016-02-28 DIAGNOSIS — M25559 Pain in unspecified hip: Secondary | ICD-10-CM | POA: Diagnosis not present

## 2016-02-28 DIAGNOSIS — M549 Dorsalgia, unspecified: Secondary | ICD-10-CM | POA: Diagnosis not present

## 2016-02-28 DIAGNOSIS — Z79891 Long term (current) use of opiate analgesic: Secondary | ICD-10-CM | POA: Diagnosis not present

## 2016-02-28 DIAGNOSIS — F17210 Nicotine dependence, cigarettes, uncomplicated: Secondary | ICD-10-CM | POA: Diagnosis not present

## 2016-02-28 DIAGNOSIS — Z79899 Other long term (current) drug therapy: Secondary | ICD-10-CM | POA: Diagnosis not present

## 2016-03-03 DIAGNOSIS — J45909 Unspecified asthma, uncomplicated: Secondary | ICD-10-CM | POA: Diagnosis not present

## 2016-03-03 DIAGNOSIS — Z9114 Patient's other noncompliance with medication regimen: Secondary | ICD-10-CM | POA: Diagnosis not present

## 2016-03-03 DIAGNOSIS — F329 Major depressive disorder, single episode, unspecified: Secondary | ICD-10-CM | POA: Diagnosis not present

## 2016-03-03 DIAGNOSIS — M25559 Pain in unspecified hip: Secondary | ICD-10-CM | POA: Diagnosis not present

## 2016-03-03 DIAGNOSIS — Z590 Homelessness: Secondary | ICD-10-CM | POA: Diagnosis not present

## 2016-03-03 DIAGNOSIS — R45851 Suicidal ideations: Secondary | ICD-10-CM | POA: Diagnosis not present

## 2016-03-03 DIAGNOSIS — F17200 Nicotine dependence, unspecified, uncomplicated: Secondary | ICD-10-CM | POA: Diagnosis not present

## 2016-03-03 DIAGNOSIS — F419 Anxiety disorder, unspecified: Secondary | ICD-10-CM | POA: Diagnosis not present

## 2016-03-03 DIAGNOSIS — G8929 Other chronic pain: Secondary | ICD-10-CM | POA: Diagnosis not present

## 2016-03-03 DIAGNOSIS — M549 Dorsalgia, unspecified: Secondary | ICD-10-CM | POA: Diagnosis not present

## 2016-03-03 DIAGNOSIS — F3341 Major depressive disorder, recurrent, in partial remission: Secondary | ICD-10-CM | POA: Diagnosis not present

## 2016-03-03 DIAGNOSIS — R569 Unspecified convulsions: Secondary | ICD-10-CM | POA: Diagnosis not present

## 2016-03-04 DIAGNOSIS — R45851 Suicidal ideations: Secondary | ICD-10-CM | POA: Diagnosis present

## 2016-03-04 DIAGNOSIS — K0889 Other specified disorders of teeth and supporting structures: Secondary | ICD-10-CM | POA: Diagnosis present

## 2016-03-04 DIAGNOSIS — Z9114 Patient's other noncompliance with medication regimen: Secondary | ICD-10-CM | POA: Diagnosis not present

## 2016-03-04 DIAGNOSIS — R45850 Homicidal ideations: Secondary | ICD-10-CM | POA: Diagnosis present

## 2016-03-04 DIAGNOSIS — Z590 Homelessness: Secondary | ICD-10-CM | POA: Diagnosis not present

## 2016-03-04 DIAGNOSIS — F314 Bipolar disorder, current episode depressed, severe, without psychotic features: Secondary | ICD-10-CM | POA: Diagnosis present

## 2016-03-04 DIAGNOSIS — M79604 Pain in right leg: Secondary | ICD-10-CM | POA: Diagnosis present

## 2016-03-04 DIAGNOSIS — M069 Rheumatoid arthritis, unspecified: Secondary | ICD-10-CM | POA: Diagnosis present

## 2016-03-04 DIAGNOSIS — G8929 Other chronic pain: Secondary | ICD-10-CM | POA: Diagnosis present

## 2016-03-04 DIAGNOSIS — F17210 Nicotine dependence, cigarettes, uncomplicated: Secondary | ICD-10-CM | POA: Diagnosis present

## 2016-03-04 DIAGNOSIS — F332 Major depressive disorder, recurrent severe without psychotic features: Secondary | ICD-10-CM | POA: Diagnosis not present

## 2016-03-04 DIAGNOSIS — Z62810 Personal history of physical and sexual abuse in childhood: Secondary | ICD-10-CM | POA: Diagnosis present

## 2016-03-04 DIAGNOSIS — M549 Dorsalgia, unspecified: Secondary | ICD-10-CM | POA: Diagnosis present

## 2016-04-08 DIAGNOSIS — R5383 Other fatigue: Secondary | ICD-10-CM | POA: Diagnosis not present

## 2016-04-08 DIAGNOSIS — Z79899 Other long term (current) drug therapy: Secondary | ICD-10-CM | POA: Diagnosis not present

## 2016-04-08 DIAGNOSIS — G894 Chronic pain syndrome: Secondary | ICD-10-CM | POA: Diagnosis not present

## 2016-04-08 DIAGNOSIS — Z760 Encounter for issue of repeat prescription: Secondary | ICD-10-CM | POA: Diagnosis not present

## 2016-04-08 DIAGNOSIS — M5431 Sciatica, right side: Secondary | ICD-10-CM | POA: Diagnosis not present

## 2016-05-12 DIAGNOSIS — E7800 Pure hypercholesterolemia, unspecified: Secondary | ICD-10-CM | POA: Diagnosis not present

## 2016-05-12 DIAGNOSIS — G894 Chronic pain syndrome: Secondary | ICD-10-CM | POA: Diagnosis not present

## 2016-05-12 DIAGNOSIS — Z79899 Other long term (current) drug therapy: Secondary | ICD-10-CM | POA: Diagnosis not present

## 2016-05-12 DIAGNOSIS — E559 Vitamin D deficiency, unspecified: Secondary | ICD-10-CM | POA: Diagnosis not present

## 2016-06-10 DIAGNOSIS — Z7251 High risk heterosexual behavior: Secondary | ICD-10-CM | POA: Diagnosis not present

## 2016-06-10 DIAGNOSIS — Z716 Tobacco abuse counseling: Secondary | ICD-10-CM | POA: Diagnosis not present

## 2016-06-10 DIAGNOSIS — G894 Chronic pain syndrome: Secondary | ICD-10-CM | POA: Diagnosis not present

## 2016-06-10 DIAGNOSIS — Z79899 Other long term (current) drug therapy: Secondary | ICD-10-CM | POA: Diagnosis not present

## 2016-06-10 DIAGNOSIS — E7800 Pure hypercholesterolemia, unspecified: Secondary | ICD-10-CM | POA: Diagnosis not present

## 2016-06-10 DIAGNOSIS — E559 Vitamin D deficiency, unspecified: Secondary | ICD-10-CM | POA: Diagnosis not present

## 2016-06-10 DIAGNOSIS — R300 Dysuria: Secondary | ICD-10-CM | POA: Diagnosis not present

## 2016-07-08 DIAGNOSIS — G894 Chronic pain syndrome: Secondary | ICD-10-CM | POA: Diagnosis not present

## 2016-07-08 DIAGNOSIS — Z79899 Other long term (current) drug therapy: Secondary | ICD-10-CM | POA: Diagnosis not present

## 2016-07-08 DIAGNOSIS — F411 Generalized anxiety disorder: Secondary | ICD-10-CM | POA: Diagnosis not present

## 2016-07-08 DIAGNOSIS — M545 Low back pain: Secondary | ICD-10-CM | POA: Diagnosis not present

## 2016-07-08 DIAGNOSIS — E559 Vitamin D deficiency, unspecified: Secondary | ICD-10-CM | POA: Diagnosis not present

## 2016-07-08 DIAGNOSIS — Z716 Tobacco abuse counseling: Secondary | ICD-10-CM | POA: Diagnosis not present

## 2016-07-20 DIAGNOSIS — M2550 Pain in unspecified joint: Secondary | ICD-10-CM | POA: Diagnosis not present

## 2016-07-20 DIAGNOSIS — E039 Hypothyroidism, unspecified: Secondary | ICD-10-CM | POA: Diagnosis not present

## 2016-07-20 DIAGNOSIS — M79671 Pain in right foot: Secondary | ICD-10-CM | POA: Diagnosis not present

## 2016-07-20 DIAGNOSIS — E559 Vitamin D deficiency, unspecified: Secondary | ICD-10-CM | POA: Diagnosis not present

## 2016-07-20 DIAGNOSIS — Z130 Encounter for screening for diseases of the blood and blood-forming organs and certain disorders involving the immune mechanism: Secondary | ICD-10-CM | POA: Diagnosis not present

## 2016-07-20 DIAGNOSIS — Z0189 Encounter for other specified special examinations: Secondary | ICD-10-CM | POA: Diagnosis not present

## 2016-07-20 DIAGNOSIS — I10 Essential (primary) hypertension: Secondary | ICD-10-CM | POA: Diagnosis not present

## 2016-07-20 DIAGNOSIS — M25551 Pain in right hip: Secondary | ICD-10-CM | POA: Diagnosis not present

## 2016-07-21 DIAGNOSIS — E782 Mixed hyperlipidemia: Secondary | ICD-10-CM | POA: Diagnosis not present

## 2016-07-21 DIAGNOSIS — E1165 Type 2 diabetes mellitus with hyperglycemia: Secondary | ICD-10-CM | POA: Diagnosis not present

## 2016-07-21 DIAGNOSIS — I10 Essential (primary) hypertension: Secondary | ICD-10-CM | POA: Diagnosis not present

## 2016-07-26 DIAGNOSIS — G8911 Acute pain due to trauma: Secondary | ICD-10-CM | POA: Diagnosis not present

## 2016-07-26 DIAGNOSIS — F411 Generalized anxiety disorder: Secondary | ICD-10-CM | POA: Diagnosis not present

## 2016-07-26 DIAGNOSIS — M545 Low back pain: Secondary | ICD-10-CM | POA: Diagnosis not present

## 2016-07-26 DIAGNOSIS — M21769 Unequal limb length (acquired), unspecified tibia and fibula: Secondary | ICD-10-CM | POA: Diagnosis not present

## 2016-07-26 DIAGNOSIS — M21759 Unequal limb length (acquired), unspecified femur: Secondary | ICD-10-CM | POA: Diagnosis not present

## 2016-07-26 DIAGNOSIS — G894 Chronic pain syndrome: Secondary | ICD-10-CM | POA: Diagnosis not present

## 2016-07-26 DIAGNOSIS — M25551 Pain in right hip: Secondary | ICD-10-CM | POA: Diagnosis not present

## 2016-07-28 DIAGNOSIS — M79601 Pain in right arm: Secondary | ICD-10-CM | POA: Diagnosis not present

## 2016-07-28 DIAGNOSIS — S59911A Unspecified injury of right forearm, initial encounter: Secondary | ICD-10-CM | POA: Diagnosis not present

## 2016-07-28 DIAGNOSIS — T22131A Burn of first degree of right upper arm, initial encounter: Secondary | ICD-10-CM | POA: Diagnosis not present

## 2016-07-28 DIAGNOSIS — R4182 Altered mental status, unspecified: Secondary | ICD-10-CM | POA: Diagnosis not present

## 2016-07-28 DIAGNOSIS — S3993XA Unspecified injury of pelvis, initial encounter: Secondary | ICD-10-CM | POA: Diagnosis not present

## 2016-07-28 DIAGNOSIS — S299XXA Unspecified injury of thorax, initial encounter: Secondary | ICD-10-CM | POA: Diagnosis not present

## 2016-07-28 DIAGNOSIS — T40601A Poisoning by unspecified narcotics, accidental (unintentional), initial encounter: Secondary | ICD-10-CM | POA: Diagnosis not present

## 2016-08-07 DIAGNOSIS — L03113 Cellulitis of right upper limb: Secondary | ICD-10-CM | POA: Diagnosis not present

## 2016-08-07 DIAGNOSIS — T2200XA Burn of unspecified degree of shoulder and upper limb, except wrist and hand, unspecified site, initial encounter: Secondary | ICD-10-CM | POA: Diagnosis not present

## 2016-09-06 DIAGNOSIS — Z7289 Other problems related to lifestyle: Secondary | ICD-10-CM | POA: Diagnosis not present

## 2016-09-06 DIAGNOSIS — G8929 Other chronic pain: Secondary | ICD-10-CM | POA: Diagnosis not present

## 2016-09-06 DIAGNOSIS — R269 Unspecified abnormalities of gait and mobility: Secondary | ICD-10-CM | POA: Diagnosis not present

## 2016-09-08 DIAGNOSIS — B1920 Unspecified viral hepatitis C without hepatic coma: Secondary | ICD-10-CM | POA: Diagnosis not present

## 2016-09-08 DIAGNOSIS — Z79891 Long term (current) use of opiate analgesic: Secondary | ICD-10-CM | POA: Diagnosis not present

## 2016-09-22 DIAGNOSIS — M87051 Idiopathic aseptic necrosis of right femur: Secondary | ICD-10-CM | POA: Diagnosis not present

## 2016-09-22 DIAGNOSIS — Z7689 Persons encountering health services in other specified circumstances: Secondary | ICD-10-CM | POA: Diagnosis not present

## 2016-09-22 DIAGNOSIS — F419 Anxiety disorder, unspecified: Secondary | ICD-10-CM | POA: Diagnosis not present

## 2016-09-22 DIAGNOSIS — M5431 Sciatica, right side: Secondary | ICD-10-CM | POA: Diagnosis not present

## 2016-09-22 DIAGNOSIS — Z1389 Encounter for screening for other disorder: Secondary | ICD-10-CM | POA: Diagnosis not present

## 2016-10-10 DIAGNOSIS — R4182 Altered mental status, unspecified: Secondary | ICD-10-CM | POA: Diagnosis not present

## 2016-10-10 DIAGNOSIS — T50901A Poisoning by unspecified drugs, medicaments and biological substances, accidental (unintentional), initial encounter: Secondary | ICD-10-CM | POA: Diagnosis not present

## 2016-10-10 DIAGNOSIS — T50904A Poisoning by unspecified drugs, medicaments and biological substances, undetermined, initial encounter: Secondary | ICD-10-CM | POA: Diagnosis not present

## 2016-10-10 DIAGNOSIS — R079 Chest pain, unspecified: Secondary | ICD-10-CM | POA: Diagnosis not present

## 2016-10-26 DIAGNOSIS — Z681 Body mass index (BMI) 19 or less, adult: Secondary | ICD-10-CM | POA: Diagnosis not present

## 2016-10-26 DIAGNOSIS — L608 Other nail disorders: Secondary | ICD-10-CM | POA: Diagnosis not present

## 2016-10-26 DIAGNOSIS — B1920 Unspecified viral hepatitis C without hepatic coma: Secondary | ICD-10-CM | POA: Diagnosis not present

## 2016-10-26 DIAGNOSIS — M87051 Idiopathic aseptic necrosis of right femur: Secondary | ICD-10-CM | POA: Diagnosis not present

## 2016-10-26 DIAGNOSIS — F3160 Bipolar disorder, current episode mixed, unspecified: Secondary | ICD-10-CM | POA: Diagnosis not present

## 2016-10-26 DIAGNOSIS — M5431 Sciatica, right side: Secondary | ICD-10-CM | POA: Diagnosis not present

## 2016-11-26 DIAGNOSIS — F419 Anxiety disorder, unspecified: Secondary | ICD-10-CM | POA: Diagnosis not present

## 2016-11-26 DIAGNOSIS — M79676 Pain in unspecified toe(s): Secondary | ICD-10-CM | POA: Diagnosis not present

## 2016-12-27 DIAGNOSIS — M79676 Pain in unspecified toe(s): Secondary | ICD-10-CM | POA: Diagnosis not present

## 2016-12-27 DIAGNOSIS — Z681 Body mass index (BMI) 19 or less, adult: Secondary | ICD-10-CM | POA: Diagnosis not present

## 2016-12-27 DIAGNOSIS — F419 Anxiety disorder, unspecified: Secondary | ICD-10-CM | POA: Diagnosis not present

## 2016-12-27 DIAGNOSIS — M87051 Idiopathic aseptic necrosis of right femur: Secondary | ICD-10-CM | POA: Diagnosis not present

## 2017-01-27 DIAGNOSIS — S90229A Contusion of unspecified lesser toe(s) with damage to nail, initial encounter: Secondary | ICD-10-CM | POA: Diagnosis not present

## 2017-02-15 DIAGNOSIS — M21751 Unequal limb length (acquired), right femur: Secondary | ICD-10-CM | POA: Diagnosis not present

## 2017-02-15 DIAGNOSIS — S90222D Contusion of left lesser toe(s) with damage to nail, subsequent encounter: Secondary | ICD-10-CM | POA: Diagnosis not present

## 2017-02-15 DIAGNOSIS — T8484XA Pain due to internal orthopedic prosthetic devices, implants and grafts, initial encounter: Secondary | ICD-10-CM | POA: Diagnosis not present

## 2017-03-11 DIAGNOSIS — M5431 Sciatica, right side: Secondary | ICD-10-CM | POA: Diagnosis not present

## 2017-03-11 DIAGNOSIS — M87051 Idiopathic aseptic necrosis of right femur: Secondary | ICD-10-CM | POA: Diagnosis not present

## 2017-03-15 DIAGNOSIS — L03314 Cellulitis of groin: Secondary | ICD-10-CM | POA: Diagnosis not present

## 2017-03-16 DIAGNOSIS — M21959 Unspecified acquired deformity of unspecified thigh: Secondary | ICD-10-CM | POA: Diagnosis not present

## 2017-03-16 DIAGNOSIS — M87051 Idiopathic aseptic necrosis of right femur: Secondary | ICD-10-CM | POA: Diagnosis not present

## 2017-03-16 DIAGNOSIS — M25551 Pain in right hip: Secondary | ICD-10-CM | POA: Diagnosis not present

## 2017-03-16 DIAGNOSIS — B1920 Unspecified viral hepatitis C without hepatic coma: Secondary | ICD-10-CM | POA: Diagnosis not present

## 2017-03-16 DIAGNOSIS — F17210 Nicotine dependence, cigarettes, uncomplicated: Secondary | ICD-10-CM | POA: Diagnosis not present

## 2017-03-16 DIAGNOSIS — S32421D Displaced fracture of posterior wall of right acetabulum, subsequent encounter for fracture with routine healing: Secondary | ICD-10-CM | POA: Diagnosis not present

## 2017-03-16 DIAGNOSIS — M1611 Unilateral primary osteoarthritis, right hip: Secondary | ICD-10-CM | POA: Diagnosis not present

## 2017-03-16 DIAGNOSIS — M87851 Other osteonecrosis, right femur: Secondary | ICD-10-CM | POA: Diagnosis not present

## 2017-03-16 DIAGNOSIS — I10 Essential (primary) hypertension: Secondary | ICD-10-CM | POA: Diagnosis not present

## 2017-04-02 DIAGNOSIS — R0781 Pleurodynia: Secondary | ICD-10-CM | POA: Diagnosis not present

## 2017-04-02 DIAGNOSIS — S299XXA Unspecified injury of thorax, initial encounter: Secondary | ICD-10-CM | POA: Diagnosis not present

## 2017-04-02 DIAGNOSIS — S79911A Unspecified injury of right hip, initial encounter: Secondary | ICD-10-CM | POA: Diagnosis not present

## 2017-04-02 DIAGNOSIS — G894 Chronic pain syndrome: Secondary | ICD-10-CM | POA: Diagnosis not present

## 2017-04-02 DIAGNOSIS — S20222A Contusion of left back wall of thorax, initial encounter: Secondary | ICD-10-CM | POA: Diagnosis not present

## 2017-04-02 DIAGNOSIS — S20212A Contusion of left front wall of thorax, initial encounter: Secondary | ICD-10-CM | POA: Diagnosis not present

## 2017-04-02 DIAGNOSIS — Z87828 Personal history of other (healed) physical injury and trauma: Secondary | ICD-10-CM | POA: Diagnosis not present

## 2017-04-02 DIAGNOSIS — M25551 Pain in right hip: Secondary | ICD-10-CM | POA: Diagnosis not present

## 2017-04-02 DIAGNOSIS — F17210 Nicotine dependence, cigarettes, uncomplicated: Secondary | ICD-10-CM | POA: Diagnosis not present

## 2017-04-02 DIAGNOSIS — G8929 Other chronic pain: Secondary | ICD-10-CM | POA: Diagnosis not present

## 2017-04-19 DIAGNOSIS — M87051 Idiopathic aseptic necrosis of right femur: Secondary | ICD-10-CM | POA: Diagnosis not present

## 2017-04-19 DIAGNOSIS — Z23 Encounter for immunization: Secondary | ICD-10-CM | POA: Diagnosis not present

## 2017-04-19 DIAGNOSIS — F419 Anxiety disorder, unspecified: Secondary | ICD-10-CM | POA: Diagnosis not present

## 2017-07-19 DIAGNOSIS — B182 Chronic viral hepatitis C: Secondary | ICD-10-CM | POA: Diagnosis not present

## 2017-07-19 DIAGNOSIS — F419 Anxiety disorder, unspecified: Secondary | ICD-10-CM | POA: Diagnosis not present

## 2017-07-19 DIAGNOSIS — F3160 Bipolar disorder, current episode mixed, unspecified: Secondary | ICD-10-CM | POA: Diagnosis not present

## 2017-07-19 DIAGNOSIS — M87051 Idiopathic aseptic necrosis of right femur: Secondary | ICD-10-CM | POA: Diagnosis not present

## 2017-09-15 DIAGNOSIS — Z79899 Other long term (current) drug therapy: Secondary | ICD-10-CM | POA: Diagnosis not present

## 2017-09-15 DIAGNOSIS — S79911A Unspecified injury of right hip, initial encounter: Secondary | ICD-10-CM | POA: Diagnosis not present

## 2017-09-15 DIAGNOSIS — M25552 Pain in left hip: Secondary | ICD-10-CM | POA: Diagnosis not present

## 2017-09-15 DIAGNOSIS — F17210 Nicotine dependence, cigarettes, uncomplicated: Secondary | ICD-10-CM | POA: Diagnosis not present

## 2017-09-15 DIAGNOSIS — M25551 Pain in right hip: Secondary | ICD-10-CM | POA: Diagnosis not present

## 2017-09-15 DIAGNOSIS — M545 Low back pain: Secondary | ICD-10-CM | POA: Diagnosis not present

## 2017-09-15 DIAGNOSIS — S3992XA Unspecified injury of lower back, initial encounter: Secondary | ICD-10-CM | POA: Diagnosis not present

## 2017-09-15 DIAGNOSIS — G8929 Other chronic pain: Secondary | ICD-10-CM | POA: Diagnosis not present

## 2017-10-11 DIAGNOSIS — M87051 Idiopathic aseptic necrosis of right femur: Secondary | ICD-10-CM | POA: Diagnosis not present

## 2017-10-11 DIAGNOSIS — M25551 Pain in right hip: Secondary | ICD-10-CM | POA: Diagnosis not present

## 2017-10-11 DIAGNOSIS — B182 Chronic viral hepatitis C: Secondary | ICD-10-CM | POA: Diagnosis not present

## 2017-10-20 DIAGNOSIS — B182 Chronic viral hepatitis C: Secondary | ICD-10-CM | POA: Diagnosis not present

## 2017-10-20 DIAGNOSIS — Z136 Encounter for screening for cardiovascular disorders: Secondary | ICD-10-CM | POA: Diagnosis not present

## 2017-10-20 DIAGNOSIS — Z7289 Other problems related to lifestyle: Secondary | ICD-10-CM | POA: Diagnosis not present

## 2017-10-20 DIAGNOSIS — F1111 Opioid abuse, in remission: Secondary | ICD-10-CM | POA: Diagnosis not present

## 2017-10-30 DIAGNOSIS — G8929 Other chronic pain: Secondary | ICD-10-CM | POA: Diagnosis not present

## 2017-10-30 DIAGNOSIS — M25551 Pain in right hip: Secondary | ICD-10-CM | POA: Diagnosis not present

## 2017-10-30 DIAGNOSIS — S79911A Unspecified injury of right hip, initial encounter: Secondary | ICD-10-CM | POA: Diagnosis not present

## 2017-11-07 DIAGNOSIS — M87051 Idiopathic aseptic necrosis of right femur: Secondary | ICD-10-CM | POA: Diagnosis not present

## 2017-11-07 DIAGNOSIS — Z6824 Body mass index (BMI) 24.0-24.9, adult: Secondary | ICD-10-CM | POA: Diagnosis not present

## 2017-11-07 DIAGNOSIS — F419 Anxiety disorder, unspecified: Secondary | ICD-10-CM | POA: Diagnosis not present

## 2017-12-02 DIAGNOSIS — Z6824 Body mass index (BMI) 24.0-24.9, adult: Secondary | ICD-10-CM | POA: Diagnosis not present

## 2017-12-02 DIAGNOSIS — J45901 Unspecified asthma with (acute) exacerbation: Secondary | ICD-10-CM | POA: Diagnosis not present

## 2017-12-02 DIAGNOSIS — M87051 Idiopathic aseptic necrosis of right femur: Secondary | ICD-10-CM | POA: Diagnosis not present

## 2017-12-02 DIAGNOSIS — R200 Anesthesia of skin: Secondary | ICD-10-CM | POA: Diagnosis not present

## 2017-12-12 DIAGNOSIS — B1920 Unspecified viral hepatitis C without hepatic coma: Secondary | ICD-10-CM | POA: Diagnosis not present

## 2017-12-12 DIAGNOSIS — B182 Chronic viral hepatitis C: Secondary | ICD-10-CM | POA: Diagnosis not present

## 2018-02-07 DIAGNOSIS — R200 Anesthesia of skin: Secondary | ICD-10-CM | POA: Diagnosis not present

## 2018-02-07 DIAGNOSIS — M87051 Idiopathic aseptic necrosis of right femur: Secondary | ICD-10-CM | POA: Diagnosis not present

## 2018-02-07 DIAGNOSIS — J45901 Unspecified asthma with (acute) exacerbation: Secondary | ICD-10-CM | POA: Diagnosis not present

## 2018-02-07 DIAGNOSIS — K219 Gastro-esophageal reflux disease without esophagitis: Secondary | ICD-10-CM | POA: Diagnosis not present

## 2018-02-16 DIAGNOSIS — B182 Chronic viral hepatitis C: Secondary | ICD-10-CM | POA: Diagnosis not present

## 2018-03-10 DIAGNOSIS — Z6823 Body mass index (BMI) 23.0-23.9, adult: Secondary | ICD-10-CM | POA: Diagnosis not present

## 2018-03-10 DIAGNOSIS — M87051 Idiopathic aseptic necrosis of right femur: Secondary | ICD-10-CM | POA: Diagnosis not present

## 2018-04-12 DIAGNOSIS — M87051 Idiopathic aseptic necrosis of right femur: Secondary | ICD-10-CM | POA: Diagnosis not present

## 2018-04-12 DIAGNOSIS — M1611 Unilateral primary osteoarthritis, right hip: Secondary | ICD-10-CM | POA: Diagnosis not present

## 2018-05-12 DIAGNOSIS — B182 Chronic viral hepatitis C: Secondary | ICD-10-CM | POA: Diagnosis not present

## 2018-09-11 DIAGNOSIS — M1651 Unilateral post-traumatic osteoarthritis, right hip: Secondary | ICD-10-CM | POA: Diagnosis not present

## 2018-09-11 DIAGNOSIS — F1911 Other psychoactive substance abuse, in remission: Secondary | ICD-10-CM | POA: Diagnosis not present

## 2018-09-11 DIAGNOSIS — Z969 Presence of functional implant, unspecified: Secondary | ICD-10-CM | POA: Diagnosis not present

## 2018-09-26 DIAGNOSIS — K047 Periapical abscess without sinus: Secondary | ICD-10-CM | POA: Diagnosis not present

## 2018-11-22 DIAGNOSIS — M87051 Idiopathic aseptic necrosis of right femur: Secondary | ICD-10-CM | POA: Diagnosis not present

## 2018-11-22 DIAGNOSIS — Z6824 Body mass index (BMI) 24.0-24.9, adult: Secondary | ICD-10-CM | POA: Diagnosis not present

## 2018-11-22 DIAGNOSIS — R569 Unspecified convulsions: Secondary | ICD-10-CM | POA: Diagnosis not present

## 2019-01-22 DIAGNOSIS — F329 Major depressive disorder, single episode, unspecified: Secondary | ICD-10-CM | POA: Diagnosis not present

## 2019-01-22 DIAGNOSIS — R112 Nausea with vomiting, unspecified: Secondary | ICD-10-CM | POA: Diagnosis not present

## 2019-01-22 DIAGNOSIS — R110 Nausea: Secondary | ICD-10-CM | POA: Diagnosis not present

## 2019-01-22 DIAGNOSIS — Z590 Homelessness: Secondary | ICD-10-CM | POA: Diagnosis not present

## 2019-01-22 DIAGNOSIS — F17210 Nicotine dependence, cigarettes, uncomplicated: Secondary | ICD-10-CM | POA: Diagnosis not present

## 2019-01-22 DIAGNOSIS — G8929 Other chronic pain: Secondary | ICD-10-CM | POA: Diagnosis not present

## 2019-01-22 DIAGNOSIS — R45851 Suicidal ideations: Secondary | ICD-10-CM | POA: Diagnosis not present

## 2019-01-22 DIAGNOSIS — R5381 Other malaise: Secondary | ICD-10-CM | POA: Diagnosis not present

## 2019-01-22 DIAGNOSIS — M25551 Pain in right hip: Secondary | ICD-10-CM | POA: Diagnosis not present

## 2019-01-22 DIAGNOSIS — F314 Bipolar disorder, current episode depressed, severe, without psychotic features: Secondary | ICD-10-CM | POA: Diagnosis not present

## 2019-01-24 DIAGNOSIS — F332 Major depressive disorder, recurrent severe without psychotic features: Secondary | ICD-10-CM | POA: Diagnosis not present

## 2019-01-25 DIAGNOSIS — F332 Major depressive disorder, recurrent severe without psychotic features: Secondary | ICD-10-CM | POA: Diagnosis not present

## 2019-01-26 DIAGNOSIS — F332 Major depressive disorder, recurrent severe without psychotic features: Secondary | ICD-10-CM | POA: Diagnosis not present

## 2019-01-27 DIAGNOSIS — F332 Major depressive disorder, recurrent severe without psychotic features: Secondary | ICD-10-CM | POA: Diagnosis not present

## 2019-01-29 DIAGNOSIS — F332 Major depressive disorder, recurrent severe without psychotic features: Secondary | ICD-10-CM | POA: Diagnosis not present

## 2019-01-30 DIAGNOSIS — F332 Major depressive disorder, recurrent severe without psychotic features: Secondary | ICD-10-CM | POA: Diagnosis not present

## 2019-01-31 DIAGNOSIS — F332 Major depressive disorder, recurrent severe without psychotic features: Secondary | ICD-10-CM | POA: Diagnosis not present

## 2019-02-01 DIAGNOSIS — F332 Major depressive disorder, recurrent severe without psychotic features: Secondary | ICD-10-CM | POA: Diagnosis not present

## 2019-02-02 DIAGNOSIS — F332 Major depressive disorder, recurrent severe without psychotic features: Secondary | ICD-10-CM | POA: Diagnosis not present

## 2019-02-03 DIAGNOSIS — F332 Major depressive disorder, recurrent severe without psychotic features: Secondary | ICD-10-CM | POA: Diagnosis not present

## 2019-02-05 DIAGNOSIS — F332 Major depressive disorder, recurrent severe without psychotic features: Secondary | ICD-10-CM | POA: Diagnosis not present

## 2019-02-06 DIAGNOSIS — F332 Major depressive disorder, recurrent severe without psychotic features: Secondary | ICD-10-CM | POA: Diagnosis not present

## 2019-02-07 DIAGNOSIS — F332 Major depressive disorder, recurrent severe without psychotic features: Secondary | ICD-10-CM | POA: Diagnosis not present

## 2019-02-08 DIAGNOSIS — F332 Major depressive disorder, recurrent severe without psychotic features: Secondary | ICD-10-CM | POA: Diagnosis not present

## 2019-04-27 ENCOUNTER — Emergency Department (HOSPITAL_COMMUNITY)
Admission: EM | Admit: 2019-04-27 | Discharge: 2019-04-27 | Disposition: A | Discharge status: Home / Self Care | Payer: Medicare Other | Attending: Emergency Medicine | Admitting: Emergency Medicine

## 2019-04-27 ENCOUNTER — Telehealth: Payer: Self-pay | Admitting: *Deleted

## 2019-04-27 ENCOUNTER — Encounter (HOSPITAL_COMMUNITY): Payer: Self-pay | Admitting: Emergency Medicine

## 2019-04-27 ENCOUNTER — Encounter (HOSPITAL_COMMUNITY): Payer: Self-pay

## 2019-04-27 ENCOUNTER — Other Ambulatory Visit: Payer: Self-pay

## 2019-04-27 DIAGNOSIS — F419 Anxiety disorder, unspecified: Secondary | ICD-10-CM | POA: Diagnosis not present

## 2019-04-27 DIAGNOSIS — F410 Panic disorder [episodic paroxysmal anxiety] without agoraphobia: Secondary | ICD-10-CM

## 2019-04-27 MED ORDER — HYDROXYZINE HCL 25 MG PO TABS: 25.0000 mg | ORAL_TABLET | Freq: Three times a day (TID) | ORAL | 0 refills | Status: AC | PRN

## 2019-04-27 MED ORDER — LORAZEPAM 1 MG PO TABS
1.0000 mg | ORAL_TABLET | Freq: Once | ORAL | Status: AC
  Administered 2019-04-27: 1 mg via ORAL
  Filled 2019-04-27: qty 1

## 2019-04-27 MED ORDER — HYDROXYZINE HCL 25 MG PO TABS
25.0000 mg | ORAL_TABLET | Freq: Once | ORAL | Status: AC
  Administered 2019-04-27: 25 mg via ORAL
  Filled 2019-04-27: qty 1

## 2019-04-27 NOTE — ED Triage Notes (Signed)
Pt reports increased anxiety lately related to his family member having a stroke. Pt has hx of anxiety and has been out of his medications due to issues with his PCP. Pt reports he needs duloxetine, seroquel and vistaril. Pt sees pain management for chronic hip pain. Pt tearful in triage.

## 2019-04-27 NOTE — Discharge Instructions (Addendum)
Please take Vistaril as prescribed.  Please follow-up with primary care to establish care for anxiety management.  Please see attached information for resources to contact the health services.  You may wish to talk to a therapist.  If any new or concerning symptoms, any thoughts of hurting yourself please return to ED.

## 2019-04-27 NOTE — ED Notes (Signed)
Pt given dc paper work pt verbalizes understanding.  

## 2019-04-27 NOTE — ED Provider Notes (Signed)
[facility] EMERGENCY DEPARTMENT Provider Note   CSN: [id number] Arrival date & time: 04/27/19  6270     History   Chief Complaint Chief Complaint  Patient presents with  . Anxiety    HPI Douglas Casey is a 38 y.o. male history of heroin use, marijuana use, anxiety, depression     HPI  Patient presents today for anxiety attack.  Patient states he has history of anxiety attacks in the past.  Has been treated for years with benzodiazepines and Cymbalta and Vistaril.  Patient states that he has been out of his medications for 5 days because he has had trouble finding a PCP.  States that he has multiple life stressors including recent move, his uncle having a stroke.  States that he lost his primary care provider due to his last hospitalization 3 months ago and has not been able to find one since.  Patient states he has a history of anxiety and depression has been hospitalized in the past for this.  Patient states most recently was hospitalized 3 months ago due to depression without suicidal ideation.   Patient also endorses right hip pain which has been chronic problem for multiple years due to avascular necrosis.  Patient states he was scheduled for surgery recently but that was postponed due to COVID-19.   Denies any chest pain, shortness of breath, abdominal pain, suicidal ideation, homicidal ideation, AVS, history of suicide attempt.  History reviewed. No pertinent past medical history.  There are no active problems to display for this patient.   History reviewed. No pertinent surgical history.      Home Medications    Prior to Admission medications   Not on File    Family History No family history on file.  Social History Social History   Tobacco Use  . Smoking status: Not on file  Substance Use Topics  . Alcohol use: Not on file  . Drug use: Not on file     Allergies   Patient has no allergy information on record.   Review of  Systems Review of Systems  Constitutional: Negative for fever.  HENT: Negative for congestion.   Respiratory: Positive for chest tightness. Negative for cough.   Cardiovascular: Negative for chest pain.  Gastrointestinal: Negative for abdominal pain.  Musculoskeletal:       Right hip pain chronic  Psychiatric/Behavioral: Negative for self-injury and suicidal ideas.     Physical Exam Updated Vital Signs BP (!) 116/99   Pulse 90   Temp 98 F (36.7 C) (Oral)   Resp 16   SpO2 99%   Physical Exam Vitals signs and nursing note reviewed.  Constitutional:      General: He is not in acute distress.    Appearance: Normal appearance. He is not ill-appearing.     Comments: Patient is tearful.  Appears anxious.  Does not appear to be in acute pain.  HENT:     Head: Normocephalic and atraumatic.     Mouth/Throat:     Mouth: Mucous membranes are moist.  Eyes:     General: No scleral icterus.       Right eye: No discharge.        Left eye: No discharge.     Conjunctiva/sclera: Conjunctivae normal.  Cardiovascular:     Rate and Rhythm: Normal rate and regular rhythm.     Pulses: Normal pulses.     Heart sounds: Normal heart sounds. No murmur.  Pulmonary:     Effort:  Pulmonary effort is normal. No respiratory distress.     Breath sounds: Normal breath sounds. No stridor. No wheezing.     Comments: Full sentences no respiratory distress no increased work of breathing.  Normal rate. Abdominal:     General: Abdomen is flat.     Palpations: Abdomen is soft.     Tenderness: There is no abdominal tenderness.  Musculoskeletal: Normal range of motion.     Right lower leg: No edema.     Left lower leg: No edema.     Comments: Moves all 4 extremities.  Follows commands able to walk.  Normal gait.  Neurological:     Mental Status: He is alert and oriented to person, place, and time. Mental status is at baseline.      ED Treatments / Results  Labs (all labs ordered are listed, but only  abnormal results are displayed) Labs Reviewed - No data to display  EKG None  Radiology No results found.  Procedures Procedures (including critical care time)  Medications Ordered in ED Medications  LORazepam (ATIVAN) tablet 1 mg (1 mg Oral Given 04/27/19 0739)  hydrOXYzine (ATARAX/VISTARIL) tablet 25 mg (25 mg Oral Given 04/27/19 0739)     Initial Impression / Assessment and Plan / ED Course  I have reviewed the triage vital signs and the nursing notes.  Pertinent labs & imaging results that were available during my care of the patient were reviewed by me and considered in my medical decision making (see chart for details).        Patient presents for panic attack she states is consistent with prior episodes of panic attack states that the past.  Patient has historically been treated for anxiety with benzos, Cymbalta and Vistaril.  Patient states that he is seeking PCP as he needs continued care.  Patient given Ativan and Vistaril during ED visit.   Doubt organic cause of symptoms as patient has benign exam and appears anxious.   Remote history of heroin use and states he frequently uses marijuana.  History of benzodiazepine use and requesting more at this time.  Discussed with patient that no benzodiazepine medications will be prescribed in the ED.  Consult to case management for aid in setting up patient with PCP appointment.   Plan to discharge patient with 30-day supply of Vistaril until patient is able to establish PCP.   Patient unwilling to wait for case management or social work.  States he will set a PCP appointment himself.  States he has medicare/medicade.  Discussed with nursing staff.  Case management begins working at 11 AM and will be able to call patient to follow-up with him.  Discussed with patient.  He is agreeable to discharge with Vistaril.  He will follow-up with case management to discuss setting for PCP appointment.  Vitals within NL during ED  visit. Patient without signs of infection, he is oriented and mentating well during our discussion. Appears anxious but not psychotic or altered.       The patient appears reasonably screened and/or stabilized for discharge and I doubt any other medical condition or other [facility] requiring further screening, evaluation, or treatment in the ED at this time prior to discharge.  Patient is hemodynamically stable, in NAD, and able to ambulate in the ED. Pain has been managed or a plan has been made for home management and has no complaints prior to discharge. Patient is comfortable with above plan and is stable for discharge at this time. All questions  were answered prior to disposition. Results from the ER workup discussed with the patient face to face and all questions answered to the best of my ability. The patient is safe for discharge with strict return precautions. Patient appears safe for discharge with appropriate follow-up.  Conveyed my impression with the patient and he voiced understanding and is agreeable to plan.   An After Visit Summary was printed and given to the patient.  Portions of this note were generated with Scientist, clinical (histocompatibility and immunogenetics). Dictation errors may occur despite best attempts at proofreading.      Final Clinical Impressions(s) / ED Diagnoses   Final diagnoses:  Anxiety attack    ED Discharge Orders    None       Gailen Shelter, [place] 04/27/19 0940    Melene Plan, DO 04/27/19 [phone]

## 2019-04-27 NOTE — ED Notes (Signed)
Pt talking to GF on the phone trying to get his counselors number

## 2019-04-27 NOTE — ED Notes (Signed)
Pt trying to get in touch with personal contacts and his counselor

## 2019-04-30 DIAGNOSIS — R52 Pain, unspecified: Secondary | ICD-10-CM | POA: Diagnosis not present

## 2019-04-30 DIAGNOSIS — S79912A Unspecified injury of left hip, initial encounter: Secondary | ICD-10-CM | POA: Diagnosis not present

## 2019-04-30 DIAGNOSIS — S79911A Unspecified injury of right hip, initial encounter: Secondary | ICD-10-CM | POA: Diagnosis not present

## 2019-04-30 DIAGNOSIS — S299XXA Unspecified injury of thorax, initial encounter: Secondary | ICD-10-CM | POA: Diagnosis not present

## 2019-04-30 DIAGNOSIS — S199XXA Unspecified injury of neck, initial encounter: Secondary | ICD-10-CM | POA: Diagnosis not present

## 2019-04-30 DIAGNOSIS — S0990XA Unspecified injury of head, initial encounter: Secondary | ICD-10-CM | POA: Diagnosis not present

## 2019-12-08 DIAGNOSIS — F1114 Opioid abuse with opioid-induced mood disorder: Secondary | ICD-10-CM | POA: Diagnosis not present

## 2019-12-08 DIAGNOSIS — F4310 Post-traumatic stress disorder, unspecified: Secondary | ICD-10-CM | POA: Diagnosis not present

## 2019-12-22 DIAGNOSIS — F4310 Post-traumatic stress disorder, unspecified: Secondary | ICD-10-CM | POA: Diagnosis not present

## 2019-12-22 DIAGNOSIS — F1114 Opioid abuse with opioid-induced mood disorder: Secondary | ICD-10-CM | POA: Diagnosis not present

## 2019-12-24 DIAGNOSIS — F4310 Post-traumatic stress disorder, unspecified: Secondary | ICD-10-CM | POA: Diagnosis not present

## 2019-12-24 DIAGNOSIS — F1114 Opioid abuse with opioid-induced mood disorder: Secondary | ICD-10-CM | POA: Diagnosis not present

## 2019-12-24 DIAGNOSIS — F411 Generalized anxiety disorder: Secondary | ICD-10-CM | POA: Diagnosis not present

## 2019-12-24 DIAGNOSIS — M79604 Pain in right leg: Secondary | ICD-10-CM | POA: Diagnosis not present

## 2019-12-24 DIAGNOSIS — F1120 Opioid dependence, uncomplicated: Secondary | ICD-10-CM | POA: Diagnosis not present

## 2019-12-24 DIAGNOSIS — M25551 Pain in right hip: Secondary | ICD-10-CM | POA: Diagnosis not present

## 2019-12-28 DIAGNOSIS — F4310 Post-traumatic stress disorder, unspecified: Secondary | ICD-10-CM | POA: Diagnosis not present

## 2019-12-28 DIAGNOSIS — F1114 Opioid abuse with opioid-induced mood disorder: Secondary | ICD-10-CM | POA: Diagnosis not present

## 2020-01-03 DIAGNOSIS — F1114 Opioid abuse with opioid-induced mood disorder: Secondary | ICD-10-CM | POA: Diagnosis not present

## 2020-01-03 DIAGNOSIS — F4310 Post-traumatic stress disorder, unspecified: Secondary | ICD-10-CM | POA: Diagnosis not present

## 2020-01-05 DIAGNOSIS — F4310 Post-traumatic stress disorder, unspecified: Secondary | ICD-10-CM | POA: Diagnosis not present

## 2020-01-05 DIAGNOSIS — F1114 Opioid abuse with opioid-induced mood disorder: Secondary | ICD-10-CM | POA: Diagnosis not present

## 2020-01-07 DIAGNOSIS — F1114 Opioid abuse with opioid-induced mood disorder: Secondary | ICD-10-CM | POA: Diagnosis not present

## 2020-01-07 DIAGNOSIS — F4310 Post-traumatic stress disorder, unspecified: Secondary | ICD-10-CM | POA: Diagnosis not present

## 2020-01-10 DIAGNOSIS — F4310 Post-traumatic stress disorder, unspecified: Secondary | ICD-10-CM | POA: Diagnosis not present

## 2020-01-10 DIAGNOSIS — F1114 Opioid abuse with opioid-induced mood disorder: Secondary | ICD-10-CM | POA: Diagnosis not present

## 2020-01-15 DIAGNOSIS — F4310 Post-traumatic stress disorder, unspecified: Secondary | ICD-10-CM | POA: Diagnosis not present

## 2020-01-15 DIAGNOSIS — F1114 Opioid abuse with opioid-induced mood disorder: Secondary | ICD-10-CM | POA: Diagnosis not present

## 2020-01-16 DIAGNOSIS — F1120 Opioid dependence, uncomplicated: Secondary | ICD-10-CM | POA: Diagnosis not present

## 2020-01-16 DIAGNOSIS — F411 Generalized anxiety disorder: Secondary | ICD-10-CM | POA: Diagnosis not present

## 2020-01-18 DIAGNOSIS — F1114 Opioid abuse with opioid-induced mood disorder: Secondary | ICD-10-CM | POA: Diagnosis not present

## 2020-01-18 DIAGNOSIS — F4310 Post-traumatic stress disorder, unspecified: Secondary | ICD-10-CM | POA: Diagnosis not present

## 2020-01-22 DIAGNOSIS — F4310 Post-traumatic stress disorder, unspecified: Secondary | ICD-10-CM | POA: Diagnosis not present

## 2020-01-22 DIAGNOSIS — F1114 Opioid abuse with opioid-induced mood disorder: Secondary | ICD-10-CM | POA: Diagnosis not present

## 2020-01-23 DIAGNOSIS — F411 Generalized anxiety disorder: Secondary | ICD-10-CM | POA: Diagnosis not present

## 2020-01-23 DIAGNOSIS — F1120 Opioid dependence, uncomplicated: Secondary | ICD-10-CM | POA: Diagnosis not present

## 2020-01-25 DIAGNOSIS — F1114 Opioid abuse with opioid-induced mood disorder: Secondary | ICD-10-CM | POA: Diagnosis not present

## 2020-01-25 DIAGNOSIS — F4310 Post-traumatic stress disorder, unspecified: Secondary | ICD-10-CM | POA: Diagnosis not present

## 2020-01-28 DIAGNOSIS — L237 Allergic contact dermatitis due to plants, except food: Secondary | ICD-10-CM | POA: Diagnosis not present

## 2020-01-28 DIAGNOSIS — G8929 Other chronic pain: Secondary | ICD-10-CM | POA: Diagnosis not present

## 2020-01-28 DIAGNOSIS — L03116 Cellulitis of left lower limb: Secondary | ICD-10-CM | POA: Diagnosis not present

## 2020-01-28 DIAGNOSIS — F17210 Nicotine dependence, cigarettes, uncomplicated: Secondary | ICD-10-CM | POA: Diagnosis not present

## 2020-01-28 DIAGNOSIS — J45909 Unspecified asthma, uncomplicated: Secondary | ICD-10-CM | POA: Diagnosis not present

## 2020-01-28 DIAGNOSIS — L259 Unspecified contact dermatitis, unspecified cause: Secondary | ICD-10-CM | POA: Diagnosis not present

## 2020-01-28 DIAGNOSIS — Z79899 Other long term (current) drug therapy: Secondary | ICD-10-CM | POA: Diagnosis not present

## 2020-02-02 DIAGNOSIS — F1114 Opioid abuse with opioid-induced mood disorder: Secondary | ICD-10-CM | POA: Diagnosis not present

## 2020-02-02 DIAGNOSIS — F4310 Post-traumatic stress disorder, unspecified: Secondary | ICD-10-CM | POA: Diagnosis not present

## 2020-02-05 DIAGNOSIS — F4310 Post-traumatic stress disorder, unspecified: Secondary | ICD-10-CM | POA: Diagnosis not present

## 2020-02-05 DIAGNOSIS — F1114 Opioid abuse with opioid-induced mood disorder: Secondary | ICD-10-CM | POA: Diagnosis not present

## 2020-02-06 DIAGNOSIS — F411 Generalized anxiety disorder: Secondary | ICD-10-CM | POA: Diagnosis not present

## 2020-02-06 DIAGNOSIS — F1120 Opioid dependence, uncomplicated: Secondary | ICD-10-CM | POA: Diagnosis not present

## 2020-02-07 DIAGNOSIS — F4310 Post-traumatic stress disorder, unspecified: Secondary | ICD-10-CM | POA: Diagnosis not present

## 2020-02-07 DIAGNOSIS — F1114 Opioid abuse with opioid-induced mood disorder: Secondary | ICD-10-CM | POA: Diagnosis not present

## 2020-02-11 DIAGNOSIS — F4310 Post-traumatic stress disorder, unspecified: Secondary | ICD-10-CM | POA: Diagnosis not present

## 2020-02-11 DIAGNOSIS — F1114 Opioid abuse with opioid-induced mood disorder: Secondary | ICD-10-CM | POA: Diagnosis not present

## 2020-02-13 DIAGNOSIS — F411 Generalized anxiety disorder: Secondary | ICD-10-CM | POA: Diagnosis not present

## 2020-02-13 DIAGNOSIS — F1120 Opioid dependence, uncomplicated: Secondary | ICD-10-CM | POA: Diagnosis not present

## 2020-02-15 DIAGNOSIS — F4310 Post-traumatic stress disorder, unspecified: Secondary | ICD-10-CM | POA: Diagnosis not present

## 2020-02-15 DIAGNOSIS — F1114 Opioid abuse with opioid-induced mood disorder: Secondary | ICD-10-CM | POA: Diagnosis not present

## 2020-02-20 DIAGNOSIS — F1120 Opioid dependence, uncomplicated: Secondary | ICD-10-CM | POA: Diagnosis not present

## 2020-02-20 DIAGNOSIS — F4310 Post-traumatic stress disorder, unspecified: Secondary | ICD-10-CM | POA: Diagnosis not present

## 2020-02-20 DIAGNOSIS — F1114 Opioid abuse with opioid-induced mood disorder: Secondary | ICD-10-CM | POA: Diagnosis not present

## 2020-02-20 DIAGNOSIS — F411 Generalized anxiety disorder: Secondary | ICD-10-CM | POA: Diagnosis not present

## 2020-02-26 DIAGNOSIS — F1114 Opioid abuse with opioid-induced mood disorder: Secondary | ICD-10-CM | POA: Diagnosis not present

## 2020-02-26 DIAGNOSIS — F4310 Post-traumatic stress disorder, unspecified: Secondary | ICD-10-CM | POA: Diagnosis not present

## 2020-02-27 DIAGNOSIS — F1120 Opioid dependence, uncomplicated: Secondary | ICD-10-CM | POA: Diagnosis not present

## 2020-02-27 DIAGNOSIS — F411 Generalized anxiety disorder: Secondary | ICD-10-CM | POA: Diagnosis not present

## 2020-02-29 DIAGNOSIS — F4310 Post-traumatic stress disorder, unspecified: Secondary | ICD-10-CM | POA: Diagnosis not present

## 2020-02-29 DIAGNOSIS — F1114 Opioid abuse with opioid-induced mood disorder: Secondary | ICD-10-CM | POA: Diagnosis not present

## 2020-03-05 DIAGNOSIS — F4310 Post-traumatic stress disorder, unspecified: Secondary | ICD-10-CM | POA: Diagnosis not present

## 2020-03-05 DIAGNOSIS — F411 Generalized anxiety disorder: Secondary | ICD-10-CM | POA: Diagnosis not present

## 2020-03-05 DIAGNOSIS — F1120 Opioid dependence, uncomplicated: Secondary | ICD-10-CM | POA: Diagnosis not present

## 2020-03-05 DIAGNOSIS — F1114 Opioid abuse with opioid-induced mood disorder: Secondary | ICD-10-CM | POA: Diagnosis not present

## 2020-03-07 DIAGNOSIS — F4310 Post-traumatic stress disorder, unspecified: Secondary | ICD-10-CM | POA: Diagnosis not present

## 2020-03-07 DIAGNOSIS — F1114 Opioid abuse with opioid-induced mood disorder: Secondary | ICD-10-CM | POA: Diagnosis not present

## 2020-03-11 DIAGNOSIS — F1114 Opioid abuse with opioid-induced mood disorder: Secondary | ICD-10-CM | POA: Diagnosis not present

## 2020-03-11 DIAGNOSIS — F4310 Post-traumatic stress disorder, unspecified: Secondary | ICD-10-CM | POA: Diagnosis not present

## 2020-03-12 DIAGNOSIS — F1120 Opioid dependence, uncomplicated: Secondary | ICD-10-CM | POA: Diagnosis not present

## 2020-03-12 DIAGNOSIS — F411 Generalized anxiety disorder: Secondary | ICD-10-CM | POA: Diagnosis not present

## 2020-03-15 DIAGNOSIS — F4310 Post-traumatic stress disorder, unspecified: Secondary | ICD-10-CM | POA: Diagnosis not present

## 2020-03-15 DIAGNOSIS — F1114 Opioid abuse with opioid-induced mood disorder: Secondary | ICD-10-CM | POA: Diagnosis not present

## 2020-03-18 DIAGNOSIS — F4310 Post-traumatic stress disorder, unspecified: Secondary | ICD-10-CM | POA: Diagnosis not present

## 2020-03-18 DIAGNOSIS — F1114 Opioid abuse with opioid-induced mood disorder: Secondary | ICD-10-CM | POA: Diagnosis not present

## 2020-03-19 DIAGNOSIS — F1120 Opioid dependence, uncomplicated: Secondary | ICD-10-CM | POA: Diagnosis not present

## 2020-03-19 DIAGNOSIS — F411 Generalized anxiety disorder: Secondary | ICD-10-CM | POA: Diagnosis not present

## 2020-03-21 DIAGNOSIS — F4310 Post-traumatic stress disorder, unspecified: Secondary | ICD-10-CM | POA: Diagnosis not present

## 2020-03-21 DIAGNOSIS — F1114 Opioid abuse with opioid-induced mood disorder: Secondary | ICD-10-CM | POA: Diagnosis not present

## 2020-03-24 DIAGNOSIS — F1114 Opioid abuse with opioid-induced mood disorder: Secondary | ICD-10-CM | POA: Diagnosis not present

## 2020-03-24 DIAGNOSIS — F4310 Post-traumatic stress disorder, unspecified: Secondary | ICD-10-CM | POA: Diagnosis not present

## 2020-03-29 DIAGNOSIS — F1114 Opioid abuse with opioid-induced mood disorder: Secondary | ICD-10-CM | POA: Diagnosis not present

## 2020-03-29 DIAGNOSIS — F4310 Post-traumatic stress disorder, unspecified: Secondary | ICD-10-CM | POA: Diagnosis not present

## 2020-03-31 DIAGNOSIS — F1114 Opioid abuse with opioid-induced mood disorder: Secondary | ICD-10-CM | POA: Diagnosis not present

## 2020-03-31 DIAGNOSIS — F4310 Post-traumatic stress disorder, unspecified: Secondary | ICD-10-CM | POA: Diagnosis not present

## 2020-04-04 DIAGNOSIS — F4310 Post-traumatic stress disorder, unspecified: Secondary | ICD-10-CM | POA: Diagnosis not present

## 2020-04-04 DIAGNOSIS — F1114 Opioid abuse with opioid-induced mood disorder: Secondary | ICD-10-CM | POA: Diagnosis not present

## 2020-04-04 DIAGNOSIS — F411 Generalized anxiety disorder: Secondary | ICD-10-CM | POA: Diagnosis not present

## 2020-04-04 DIAGNOSIS — F1120 Opioid dependence, uncomplicated: Secondary | ICD-10-CM | POA: Diagnosis not present

## 2020-04-08 DIAGNOSIS — F4310 Post-traumatic stress disorder, unspecified: Secondary | ICD-10-CM | POA: Diagnosis not present

## 2020-04-08 DIAGNOSIS — F1114 Opioid abuse with opioid-induced mood disorder: Secondary | ICD-10-CM | POA: Diagnosis not present

## 2020-04-10 DIAGNOSIS — F1114 Opioid abuse with opioid-induced mood disorder: Secondary | ICD-10-CM | POA: Diagnosis not present

## 2020-04-10 DIAGNOSIS — F4310 Post-traumatic stress disorder, unspecified: Secondary | ICD-10-CM | POA: Diagnosis not present

## 2020-04-11 DIAGNOSIS — F411 Generalized anxiety disorder: Secondary | ICD-10-CM | POA: Diagnosis not present

## 2020-04-11 DIAGNOSIS — F1120 Opioid dependence, uncomplicated: Secondary | ICD-10-CM | POA: Diagnosis not present

## 2020-04-16 DIAGNOSIS — F4310 Post-traumatic stress disorder, unspecified: Secondary | ICD-10-CM | POA: Diagnosis not present

## 2020-04-16 DIAGNOSIS — F1114 Opioid abuse with opioid-induced mood disorder: Secondary | ICD-10-CM | POA: Diagnosis not present

## 2020-04-18 DIAGNOSIS — F1120 Opioid dependence, uncomplicated: Secondary | ICD-10-CM | POA: Diagnosis not present

## 2020-04-18 DIAGNOSIS — F411 Generalized anxiety disorder: Secondary | ICD-10-CM | POA: Diagnosis not present

## 2020-04-19 DIAGNOSIS — F4310 Post-traumatic stress disorder, unspecified: Secondary | ICD-10-CM | POA: Diagnosis not present

## 2020-04-19 DIAGNOSIS — F1114 Opioid abuse with opioid-induced mood disorder: Secondary | ICD-10-CM | POA: Diagnosis not present

## 2020-04-23 DIAGNOSIS — F1114 Opioid abuse with opioid-induced mood disorder: Secondary | ICD-10-CM | POA: Diagnosis not present

## 2020-04-23 DIAGNOSIS — F4310 Post-traumatic stress disorder, unspecified: Secondary | ICD-10-CM | POA: Diagnosis not present

## 2020-04-26 DIAGNOSIS — F4310 Post-traumatic stress disorder, unspecified: Secondary | ICD-10-CM | POA: Diagnosis not present

## 2020-04-26 DIAGNOSIS — F1114 Opioid abuse with opioid-induced mood disorder: Secondary | ICD-10-CM | POA: Diagnosis not present

## 2020-04-30 DIAGNOSIS — F1114 Opioid abuse with opioid-induced mood disorder: Secondary | ICD-10-CM | POA: Diagnosis not present

## 2020-04-30 DIAGNOSIS — F4310 Post-traumatic stress disorder, unspecified: Secondary | ICD-10-CM | POA: Diagnosis not present

## 2020-05-02 DIAGNOSIS — F411 Generalized anxiety disorder: Secondary | ICD-10-CM | POA: Diagnosis not present

## 2020-05-02 DIAGNOSIS — F1120 Opioid dependence, uncomplicated: Secondary | ICD-10-CM | POA: Diagnosis not present

## 2020-05-03 DIAGNOSIS — F1114 Opioid abuse with opioid-induced mood disorder: Secondary | ICD-10-CM | POA: Diagnosis not present

## 2020-05-03 DIAGNOSIS — F4310 Post-traumatic stress disorder, unspecified: Secondary | ICD-10-CM | POA: Diagnosis not present

## 2020-05-09 DIAGNOSIS — F1114 Opioid abuse with opioid-induced mood disorder: Secondary | ICD-10-CM | POA: Diagnosis not present

## 2020-05-09 DIAGNOSIS — F4310 Post-traumatic stress disorder, unspecified: Secondary | ICD-10-CM | POA: Diagnosis not present

## 2020-05-14 DIAGNOSIS — F1114 Opioid abuse with opioid-induced mood disorder: Secondary | ICD-10-CM | POA: Diagnosis not present

## 2020-05-14 DIAGNOSIS — F4310 Post-traumatic stress disorder, unspecified: Secondary | ICD-10-CM | POA: Diagnosis not present

## 2020-05-16 DIAGNOSIS — F411 Generalized anxiety disorder: Secondary | ICD-10-CM | POA: Diagnosis not present

## 2020-05-16 DIAGNOSIS — F1120 Opioid dependence, uncomplicated: Secondary | ICD-10-CM | POA: Diagnosis not present

## 2020-05-24 DIAGNOSIS — F4310 Post-traumatic stress disorder, unspecified: Secondary | ICD-10-CM | POA: Diagnosis not present

## 2020-05-24 DIAGNOSIS — F1114 Opioid abuse with opioid-induced mood disorder: Secondary | ICD-10-CM | POA: Diagnosis not present

## 2020-05-28 DIAGNOSIS — F4310 Post-traumatic stress disorder, unspecified: Secondary | ICD-10-CM | POA: Diagnosis not present

## 2020-05-28 DIAGNOSIS — F1114 Opioid abuse with opioid-induced mood disorder: Secondary | ICD-10-CM | POA: Diagnosis not present

## 2020-05-30 DIAGNOSIS — F1120 Opioid dependence, uncomplicated: Secondary | ICD-10-CM | POA: Diagnosis not present

## 2020-05-30 DIAGNOSIS — F411 Generalized anxiety disorder: Secondary | ICD-10-CM | POA: Diagnosis not present

## 2020-06-04 DIAGNOSIS — F4310 Post-traumatic stress disorder, unspecified: Secondary | ICD-10-CM | POA: Diagnosis not present

## 2020-06-04 DIAGNOSIS — F1114 Opioid abuse with opioid-induced mood disorder: Secondary | ICD-10-CM | POA: Diagnosis not present

## 2020-06-07 DIAGNOSIS — F1114 Opioid abuse with opioid-induced mood disorder: Secondary | ICD-10-CM | POA: Diagnosis not present

## 2020-06-07 DIAGNOSIS — F4310 Post-traumatic stress disorder, unspecified: Secondary | ICD-10-CM | POA: Diagnosis not present

## 2020-06-09 DIAGNOSIS — F1114 Opioid abuse with opioid-induced mood disorder: Secondary | ICD-10-CM | POA: Diagnosis not present

## 2020-06-09 DIAGNOSIS — F4310 Post-traumatic stress disorder, unspecified: Secondary | ICD-10-CM | POA: Diagnosis not present

## 2020-06-11 DIAGNOSIS — F411 Generalized anxiety disorder: Secondary | ICD-10-CM | POA: Diagnosis not present

## 2020-06-11 DIAGNOSIS — F1120 Opioid dependence, uncomplicated: Secondary | ICD-10-CM | POA: Diagnosis not present

## 2020-06-14 DIAGNOSIS — F4310 Post-traumatic stress disorder, unspecified: Secondary | ICD-10-CM | POA: Diagnosis not present

## 2020-06-14 DIAGNOSIS — F1114 Opioid abuse with opioid-induced mood disorder: Secondary | ICD-10-CM | POA: Diagnosis not present

## 2020-06-16 DIAGNOSIS — F4310 Post-traumatic stress disorder, unspecified: Secondary | ICD-10-CM | POA: Diagnosis not present

## 2020-06-16 DIAGNOSIS — F1114 Opioid abuse with opioid-induced mood disorder: Secondary | ICD-10-CM | POA: Diagnosis not present

## 2020-06-17 DIAGNOSIS — L03116 Cellulitis of left lower limb: Secondary | ICD-10-CM | POA: Diagnosis not present

## 2020-06-17 DIAGNOSIS — R600 Localized edema: Secondary | ICD-10-CM | POA: Diagnosis not present

## 2020-06-17 DIAGNOSIS — M71162 Other infective bursitis, left knee: Secondary | ICD-10-CM | POA: Diagnosis not present

## 2020-06-17 DIAGNOSIS — M069 Rheumatoid arthritis, unspecified: Secondary | ICD-10-CM | POA: Diagnosis not present

## 2020-06-17 DIAGNOSIS — M25462 Effusion, left knee: Secondary | ICD-10-CM | POA: Diagnosis not present

## 2020-06-17 DIAGNOSIS — M7989 Other specified soft tissue disorders: Secondary | ICD-10-CM | POA: Diagnosis not present

## 2020-06-20 DIAGNOSIS — F4310 Post-traumatic stress disorder, unspecified: Secondary | ICD-10-CM | POA: Diagnosis not present

## 2020-06-20 DIAGNOSIS — F1114 Opioid abuse with opioid-induced mood disorder: Secondary | ICD-10-CM | POA: Diagnosis not present

## 2020-06-23 DIAGNOSIS — F4310 Post-traumatic stress disorder, unspecified: Secondary | ICD-10-CM | POA: Diagnosis not present

## 2020-06-23 DIAGNOSIS — F1114 Opioid abuse with opioid-induced mood disorder: Secondary | ICD-10-CM | POA: Diagnosis not present

## 2020-06-25 DIAGNOSIS — F4310 Post-traumatic stress disorder, unspecified: Secondary | ICD-10-CM | POA: Diagnosis not present

## 2020-06-25 DIAGNOSIS — F1114 Opioid abuse with opioid-induced mood disorder: Secondary | ICD-10-CM | POA: Diagnosis not present

## 2020-06-25 DIAGNOSIS — F1120 Opioid dependence, uncomplicated: Secondary | ICD-10-CM | POA: Diagnosis not present

## 2020-06-25 DIAGNOSIS — F411 Generalized anxiety disorder: Secondary | ICD-10-CM | POA: Diagnosis not present

## 2020-06-26 DIAGNOSIS — M71162 Other infective bursitis, left knee: Secondary | ICD-10-CM | POA: Diagnosis not present

## 2020-07-02 DIAGNOSIS — F1114 Opioid abuse with opioid-induced mood disorder: Secondary | ICD-10-CM | POA: Diagnosis not present

## 2020-07-02 DIAGNOSIS — F4310 Post-traumatic stress disorder, unspecified: Secondary | ICD-10-CM | POA: Diagnosis not present

## 2020-07-07 DIAGNOSIS — F4310 Post-traumatic stress disorder, unspecified: Secondary | ICD-10-CM | POA: Diagnosis not present

## 2020-07-07 DIAGNOSIS — F1114 Opioid abuse with opioid-induced mood disorder: Secondary | ICD-10-CM | POA: Diagnosis not present

## 2020-07-09 DIAGNOSIS — F1120 Opioid dependence, uncomplicated: Secondary | ICD-10-CM | POA: Diagnosis not present

## 2020-07-09 DIAGNOSIS — F411 Generalized anxiety disorder: Secondary | ICD-10-CM | POA: Diagnosis not present

## 2020-07-10 DIAGNOSIS — M71162 Other infective bursitis, left knee: Secondary | ICD-10-CM | POA: Diagnosis not present

## 2020-07-11 DIAGNOSIS — F1114 Opioid abuse with opioid-induced mood disorder: Secondary | ICD-10-CM | POA: Diagnosis not present

## 2020-07-11 DIAGNOSIS — F4310 Post-traumatic stress disorder, unspecified: Secondary | ICD-10-CM | POA: Diagnosis not present

## 2020-07-16 DIAGNOSIS — F4310 Post-traumatic stress disorder, unspecified: Secondary | ICD-10-CM | POA: Diagnosis not present

## 2020-07-16 DIAGNOSIS — F1114 Opioid abuse with opioid-induced mood disorder: Secondary | ICD-10-CM | POA: Diagnosis not present

## 2020-07-19 DIAGNOSIS — F1114 Opioid abuse with opioid-induced mood disorder: Secondary | ICD-10-CM | POA: Diagnosis not present

## 2020-07-19 DIAGNOSIS — F4310 Post-traumatic stress disorder, unspecified: Secondary | ICD-10-CM | POA: Diagnosis not present

## 2020-07-21 DIAGNOSIS — F1114 Opioid abuse with opioid-induced mood disorder: Secondary | ICD-10-CM | POA: Diagnosis not present

## 2020-07-21 DIAGNOSIS — F4310 Post-traumatic stress disorder, unspecified: Secondary | ICD-10-CM | POA: Diagnosis not present

## 2020-07-23 DIAGNOSIS — F1114 Opioid abuse with opioid-induced mood disorder: Secondary | ICD-10-CM | POA: Diagnosis not present

## 2020-07-23 DIAGNOSIS — F411 Generalized anxiety disorder: Secondary | ICD-10-CM | POA: Diagnosis not present

## 2020-07-23 DIAGNOSIS — F1120 Opioid dependence, uncomplicated: Secondary | ICD-10-CM | POA: Diagnosis not present

## 2020-07-23 DIAGNOSIS — F4310 Post-traumatic stress disorder, unspecified: Secondary | ICD-10-CM | POA: Diagnosis not present

## 2020-07-30 DIAGNOSIS — F4310 Post-traumatic stress disorder, unspecified: Secondary | ICD-10-CM | POA: Diagnosis not present

## 2020-07-30 DIAGNOSIS — F1114 Opioid abuse with opioid-induced mood disorder: Secondary | ICD-10-CM | POA: Diagnosis not present

## 2020-08-02 DIAGNOSIS — F1114 Opioid abuse with opioid-induced mood disorder: Secondary | ICD-10-CM | POA: Diagnosis not present

## 2020-08-02 DIAGNOSIS — F4310 Post-traumatic stress disorder, unspecified: Secondary | ICD-10-CM | POA: Diagnosis not present

## 2020-08-05 DIAGNOSIS — F4310 Post-traumatic stress disorder, unspecified: Secondary | ICD-10-CM | POA: Diagnosis not present

## 2020-08-05 DIAGNOSIS — F1114 Opioid abuse with opioid-induced mood disorder: Secondary | ICD-10-CM | POA: Diagnosis not present

## 2020-08-08 DIAGNOSIS — F1114 Opioid abuse with opioid-induced mood disorder: Secondary | ICD-10-CM | POA: Diagnosis not present

## 2020-08-08 DIAGNOSIS — F4310 Post-traumatic stress disorder, unspecified: Secondary | ICD-10-CM | POA: Diagnosis not present

## 2020-08-12 DIAGNOSIS — F4310 Post-traumatic stress disorder, unspecified: Secondary | ICD-10-CM | POA: Diagnosis not present

## 2020-08-12 DIAGNOSIS — F1114 Opioid abuse with opioid-induced mood disorder: Secondary | ICD-10-CM | POA: Diagnosis not present

## 2020-08-20 DIAGNOSIS — F1120 Opioid dependence, uncomplicated: Secondary | ICD-10-CM | POA: Diagnosis not present

## 2020-08-20 DIAGNOSIS — Z20822 Contact with and (suspected) exposure to covid-19: Secondary | ICD-10-CM | POA: Diagnosis not present

## 2020-08-20 DIAGNOSIS — F411 Generalized anxiety disorder: Secondary | ICD-10-CM | POA: Diagnosis not present

## 2020-08-20 DIAGNOSIS — B349 Viral infection, unspecified: Secondary | ICD-10-CM | POA: Diagnosis not present

## 2020-09-06 DIAGNOSIS — F4310 Post-traumatic stress disorder, unspecified: Secondary | ICD-10-CM | POA: Diagnosis not present

## 2020-09-06 DIAGNOSIS — F1114 Opioid abuse with opioid-induced mood disorder: Secondary | ICD-10-CM | POA: Diagnosis not present

## 2020-09-09 DIAGNOSIS — F1114 Opioid abuse with opioid-induced mood disorder: Secondary | ICD-10-CM | POA: Diagnosis not present

## 2020-09-09 DIAGNOSIS — F4310 Post-traumatic stress disorder, unspecified: Secondary | ICD-10-CM | POA: Diagnosis not present

## 2020-09-09 DIAGNOSIS — F411 Generalized anxiety disorder: Secondary | ICD-10-CM | POA: Diagnosis not present

## 2020-09-09 DIAGNOSIS — F1120 Opioid dependence, uncomplicated: Secondary | ICD-10-CM | POA: Diagnosis not present

## 2020-09-12 DIAGNOSIS — F4310 Post-traumatic stress disorder, unspecified: Secondary | ICD-10-CM | POA: Diagnosis not present

## 2020-09-12 DIAGNOSIS — F1114 Opioid abuse with opioid-induced mood disorder: Secondary | ICD-10-CM | POA: Diagnosis not present

## 2020-09-18 DIAGNOSIS — F1114 Opioid abuse with opioid-induced mood disorder: Secondary | ICD-10-CM | POA: Diagnosis not present

## 2020-09-18 DIAGNOSIS — F4310 Post-traumatic stress disorder, unspecified: Secondary | ICD-10-CM | POA: Diagnosis not present

## 2020-09-24 DIAGNOSIS — F1120 Opioid dependence, uncomplicated: Secondary | ICD-10-CM | POA: Diagnosis not present

## 2020-09-24 DIAGNOSIS — F411 Generalized anxiety disorder: Secondary | ICD-10-CM | POA: Diagnosis not present

## 2020-10-01 DIAGNOSIS — F1120 Opioid dependence, uncomplicated: Secondary | ICD-10-CM | POA: Diagnosis not present

## 2020-10-01 DIAGNOSIS — F411 Generalized anxiety disorder: Secondary | ICD-10-CM | POA: Diagnosis not present

## 2020-10-01 DIAGNOSIS — F4310 Post-traumatic stress disorder, unspecified: Secondary | ICD-10-CM | POA: Diagnosis not present

## 2020-10-01 DIAGNOSIS — F1114 Opioid abuse with opioid-induced mood disorder: Secondary | ICD-10-CM | POA: Diagnosis not present

## 2020-10-03 DIAGNOSIS — F1114 Opioid abuse with opioid-induced mood disorder: Secondary | ICD-10-CM | POA: Diagnosis not present

## 2020-10-03 DIAGNOSIS — F4310 Post-traumatic stress disorder, unspecified: Secondary | ICD-10-CM | POA: Diagnosis not present

## 2020-10-06 DIAGNOSIS — F1114 Opioid abuse with opioid-induced mood disorder: Secondary | ICD-10-CM | POA: Diagnosis not present

## 2020-10-06 DIAGNOSIS — F4310 Post-traumatic stress disorder, unspecified: Secondary | ICD-10-CM | POA: Diagnosis not present

## 2020-10-10 DIAGNOSIS — F1114 Opioid abuse with opioid-induced mood disorder: Secondary | ICD-10-CM | POA: Diagnosis not present

## 2020-10-10 DIAGNOSIS — F4310 Post-traumatic stress disorder, unspecified: Secondary | ICD-10-CM | POA: Diagnosis not present

## 2020-10-13 DIAGNOSIS — F411 Generalized anxiety disorder: Secondary | ICD-10-CM | POA: Diagnosis not present

## 2020-10-13 DIAGNOSIS — F1120 Opioid dependence, uncomplicated: Secondary | ICD-10-CM | POA: Diagnosis not present

## 2020-10-16 DIAGNOSIS — F1114 Opioid abuse with opioid-induced mood disorder: Secondary | ICD-10-CM | POA: Diagnosis not present

## 2020-10-16 DIAGNOSIS — F4310 Post-traumatic stress disorder, unspecified: Secondary | ICD-10-CM | POA: Diagnosis not present

## 2020-10-31 DIAGNOSIS — F4310 Post-traumatic stress disorder, unspecified: Secondary | ICD-10-CM | POA: Diagnosis not present

## 2020-10-31 DIAGNOSIS — F1114 Opioid abuse with opioid-induced mood disorder: Secondary | ICD-10-CM | POA: Diagnosis not present

## 2020-11-04 DIAGNOSIS — F4310 Post-traumatic stress disorder, unspecified: Secondary | ICD-10-CM | POA: Diagnosis not present

## 2020-11-04 DIAGNOSIS — F1114 Opioid abuse with opioid-induced mood disorder: Secondary | ICD-10-CM | POA: Diagnosis not present

## 2020-11-08 DIAGNOSIS — F4310 Post-traumatic stress disorder, unspecified: Secondary | ICD-10-CM | POA: Diagnosis not present

## 2020-11-08 DIAGNOSIS — F1114 Opioid abuse with opioid-induced mood disorder: Secondary | ICD-10-CM | POA: Diagnosis not present

## 2020-11-10 DIAGNOSIS — F1114 Opioid abuse with opioid-induced mood disorder: Secondary | ICD-10-CM | POA: Diagnosis not present

## 2020-11-10 DIAGNOSIS — F4310 Post-traumatic stress disorder, unspecified: Secondary | ICD-10-CM | POA: Diagnosis not present

## 2020-11-19 DIAGNOSIS — F4310 Post-traumatic stress disorder, unspecified: Secondary | ICD-10-CM | POA: Diagnosis not present

## 2020-11-19 DIAGNOSIS — F1114 Opioid abuse with opioid-induced mood disorder: Secondary | ICD-10-CM | POA: Diagnosis not present

## 2020-11-25 DIAGNOSIS — F4310 Post-traumatic stress disorder, unspecified: Secondary | ICD-10-CM | POA: Diagnosis not present

## 2020-11-25 DIAGNOSIS — F1114 Opioid abuse with opioid-induced mood disorder: Secondary | ICD-10-CM | POA: Diagnosis not present

## 2020-11-27 DIAGNOSIS — M87051 Idiopathic aseptic necrosis of right femur: Secondary | ICD-10-CM | POA: Diagnosis not present

## 2020-11-27 DIAGNOSIS — M129 Arthropathy, unspecified: Secondary | ICD-10-CM | POA: Diagnosis not present

## 2020-11-27 DIAGNOSIS — D539 Nutritional anemia, unspecified: Secondary | ICD-10-CM | POA: Diagnosis not present

## 2020-11-27 DIAGNOSIS — G8929 Other chronic pain: Secondary | ICD-10-CM | POA: Diagnosis not present

## 2020-11-27 DIAGNOSIS — Z6822 Body mass index (BMI) 22.0-22.9, adult: Secondary | ICD-10-CM | POA: Diagnosis not present

## 2020-11-27 DIAGNOSIS — M25571 Pain in right ankle and joints of right foot: Secondary | ICD-10-CM | POA: Diagnosis not present

## 2020-11-27 DIAGNOSIS — E559 Vitamin D deficiency, unspecified: Secondary | ICD-10-CM | POA: Diagnosis not present

## 2020-11-27 DIAGNOSIS — Z1339 Encounter for screening examination for other mental health and behavioral disorders: Secondary | ICD-10-CM | POA: Diagnosis not present

## 2020-11-27 DIAGNOSIS — Z1331 Encounter for screening for depression: Secondary | ICD-10-CM | POA: Diagnosis not present

## 2020-11-27 DIAGNOSIS — R5383 Other fatigue: Secondary | ICD-10-CM | POA: Diagnosis not present

## 2020-11-27 DIAGNOSIS — Z79899 Other long term (current) drug therapy: Secondary | ICD-10-CM | POA: Diagnosis not present

## 2020-11-27 DIAGNOSIS — M545 Low back pain, unspecified: Secondary | ICD-10-CM | POA: Diagnosis not present

## 2020-11-27 DIAGNOSIS — F17210 Nicotine dependence, cigarettes, uncomplicated: Secondary | ICD-10-CM | POA: Diagnosis not present

## 2020-11-27 DIAGNOSIS — Z20822 Contact with and (suspected) exposure to covid-19: Secondary | ICD-10-CM | POA: Diagnosis not present

## 2020-11-28 DIAGNOSIS — F411 Generalized anxiety disorder: Secondary | ICD-10-CM | POA: Diagnosis not present

## 2020-11-28 DIAGNOSIS — F1120 Opioid dependence, uncomplicated: Secondary | ICD-10-CM | POA: Diagnosis not present

## 2020-11-29 DIAGNOSIS — F1114 Opioid abuse with opioid-induced mood disorder: Secondary | ICD-10-CM | POA: Diagnosis not present

## 2020-11-29 DIAGNOSIS — F4310 Post-traumatic stress disorder, unspecified: Secondary | ICD-10-CM | POA: Diagnosis not present

## 2020-12-03 DIAGNOSIS — F1114 Opioid abuse with opioid-induced mood disorder: Secondary | ICD-10-CM | POA: Diagnosis not present

## 2020-12-03 DIAGNOSIS — F4310 Post-traumatic stress disorder, unspecified: Secondary | ICD-10-CM | POA: Diagnosis not present

## 2020-12-08 DIAGNOSIS — M47816 Spondylosis without myelopathy or radiculopathy, lumbar region: Secondary | ICD-10-CM | POA: Diagnosis not present

## 2020-12-08 DIAGNOSIS — M47812 Spondylosis without myelopathy or radiculopathy, cervical region: Secondary | ICD-10-CM | POA: Diagnosis not present

## 2020-12-08 DIAGNOSIS — R1084 Generalized abdominal pain: Secondary | ICD-10-CM | POA: Diagnosis not present

## 2020-12-08 DIAGNOSIS — F419 Anxiety disorder, unspecified: Secondary | ICD-10-CM | POA: Diagnosis not present

## 2020-12-08 DIAGNOSIS — M47814 Spondylosis without myelopathy or radiculopathy, thoracic region: Secondary | ICD-10-CM | POA: Diagnosis not present

## 2020-12-08 DIAGNOSIS — M47817 Spondylosis without myelopathy or radiculopathy, lumbosacral region: Secondary | ICD-10-CM | POA: Diagnosis not present

## 2020-12-08 DIAGNOSIS — G894 Chronic pain syndrome: Secondary | ICD-10-CM | POA: Diagnosis not present

## 2020-12-08 DIAGNOSIS — R600 Localized edema: Secondary | ICD-10-CM | POA: Diagnosis not present

## 2020-12-08 DIAGNOSIS — R112 Nausea with vomiting, unspecified: Secondary | ICD-10-CM | POA: Diagnosis not present

## 2020-12-08 DIAGNOSIS — M545 Low back pain, unspecified: Secondary | ICD-10-CM | POA: Diagnosis not present

## 2020-12-08 DIAGNOSIS — R1111 Vomiting without nausea: Secondary | ICD-10-CM | POA: Diagnosis not present

## 2020-12-08 DIAGNOSIS — R231 Pallor: Secondary | ICD-10-CM | POA: Diagnosis not present

## 2020-12-08 DIAGNOSIS — R197 Diarrhea, unspecified: Secondary | ICD-10-CM | POA: Diagnosis not present

## 2020-12-08 DIAGNOSIS — F17210 Nicotine dependence, cigarettes, uncomplicated: Secondary | ICD-10-CM | POA: Diagnosis not present

## 2020-12-08 DIAGNOSIS — R251 Tremor, unspecified: Secondary | ICD-10-CM | POA: Diagnosis not present

## 2020-12-08 DIAGNOSIS — M4802 Spinal stenosis, cervical region: Secondary | ICD-10-CM | POA: Diagnosis not present

## 2020-12-08 DIAGNOSIS — F4310 Post-traumatic stress disorder, unspecified: Secondary | ICD-10-CM | POA: Diagnosis not present

## 2020-12-08 DIAGNOSIS — R110 Nausea: Secondary | ICD-10-CM | POA: Diagnosis not present

## 2020-12-08 DIAGNOSIS — G40909 Epilepsy, unspecified, not intractable, without status epilepticus: Secondary | ICD-10-CM | POA: Diagnosis not present

## 2020-12-08 DIAGNOSIS — M879 Osteonecrosis, unspecified: Secondary | ICD-10-CM | POA: Diagnosis not present

## 2020-12-08 DIAGNOSIS — R531 Weakness: Secondary | ICD-10-CM | POA: Diagnosis not present

## 2020-12-08 DIAGNOSIS — R4182 Altered mental status, unspecified: Secondary | ICD-10-CM | POA: Diagnosis not present

## 2020-12-08 DIAGNOSIS — F19239 Other psychoactive substance dependence with withdrawal, unspecified: Secondary | ICD-10-CM | POA: Diagnosis not present

## 2020-12-08 DIAGNOSIS — G4489 Other headache syndrome: Secondary | ICD-10-CM | POA: Diagnosis not present

## 2020-12-08 DIAGNOSIS — R1110 Vomiting, unspecified: Secondary | ICD-10-CM | POA: Diagnosis not present

## 2020-12-08 DIAGNOSIS — G35 Multiple sclerosis: Secondary | ICD-10-CM | POA: Diagnosis not present

## 2020-12-08 DIAGNOSIS — J45909 Unspecified asthma, uncomplicated: Secondary | ICD-10-CM | POA: Diagnosis not present

## 2020-12-08 DIAGNOSIS — R1032 Left lower quadrant pain: Secondary | ICD-10-CM | POA: Diagnosis not present

## 2020-12-08 DIAGNOSIS — R0602 Shortness of breath: Secondary | ICD-10-CM | POA: Diagnosis not present

## 2020-12-08 DIAGNOSIS — F1114 Opioid abuse with opioid-induced mood disorder: Secondary | ICD-10-CM | POA: Diagnosis not present

## 2020-12-08 DIAGNOSIS — R109 Unspecified abdominal pain: Secondary | ICD-10-CM | POA: Diagnosis not present

## 2020-12-09 DIAGNOSIS — F4310 Post-traumatic stress disorder, unspecified: Secondary | ICD-10-CM | POA: Diagnosis not present

## 2020-12-09 DIAGNOSIS — F1114 Opioid abuse with opioid-induced mood disorder: Secondary | ICD-10-CM | POA: Diagnosis not present

## 2020-12-11 DIAGNOSIS — F1120 Opioid dependence, uncomplicated: Secondary | ICD-10-CM | POA: Diagnosis not present

## 2020-12-11 DIAGNOSIS — F411 Generalized anxiety disorder: Secondary | ICD-10-CM | POA: Diagnosis not present

## 2020-12-12 DIAGNOSIS — F1114 Opioid abuse with opioid-induced mood disorder: Secondary | ICD-10-CM | POA: Diagnosis not present

## 2020-12-12 DIAGNOSIS — F4310 Post-traumatic stress disorder, unspecified: Secondary | ICD-10-CM | POA: Diagnosis not present

## 2020-12-16 DIAGNOSIS — F4310 Post-traumatic stress disorder, unspecified: Secondary | ICD-10-CM | POA: Diagnosis not present

## 2020-12-16 DIAGNOSIS — Z79899 Other long term (current) drug therapy: Secondary | ICD-10-CM | POA: Diagnosis not present

## 2020-12-16 DIAGNOSIS — F1114 Opioid abuse with opioid-induced mood disorder: Secondary | ICD-10-CM | POA: Diagnosis not present

## 2020-12-16 DIAGNOSIS — M25571 Pain in right ankle and joints of right foot: Secondary | ICD-10-CM | POA: Diagnosis not present

## 2020-12-16 DIAGNOSIS — F1120 Opioid dependence, uncomplicated: Secondary | ICD-10-CM | POA: Diagnosis not present

## 2020-12-16 DIAGNOSIS — Z6822 Body mass index (BMI) 22.0-22.9, adult: Secondary | ICD-10-CM | POA: Diagnosis not present

## 2020-12-16 DIAGNOSIS — G8929 Other chronic pain: Secondary | ICD-10-CM | POA: Diagnosis not present

## 2020-12-16 DIAGNOSIS — M545 Low back pain, unspecified: Secondary | ICD-10-CM | POA: Diagnosis not present

## 2020-12-19 DIAGNOSIS — Z79899 Other long term (current) drug therapy: Secondary | ICD-10-CM | POA: Diagnosis not present

## 2020-12-19 DIAGNOSIS — F4310 Post-traumatic stress disorder, unspecified: Secondary | ICD-10-CM | POA: Diagnosis not present

## 2020-12-19 DIAGNOSIS — F1114 Opioid abuse with opioid-induced mood disorder: Secondary | ICD-10-CM | POA: Diagnosis not present

## 2020-12-23 DIAGNOSIS — M545 Low back pain, unspecified: Secondary | ICD-10-CM | POA: Diagnosis not present

## 2020-12-23 DIAGNOSIS — F1120 Opioid dependence, uncomplicated: Secondary | ICD-10-CM | POA: Diagnosis not present

## 2020-12-23 DIAGNOSIS — Z6822 Body mass index (BMI) 22.0-22.9, adult: Secondary | ICD-10-CM | POA: Diagnosis not present

## 2020-12-23 DIAGNOSIS — M25571 Pain in right ankle and joints of right foot: Secondary | ICD-10-CM | POA: Diagnosis not present

## 2020-12-23 DIAGNOSIS — G8929 Other chronic pain: Secondary | ICD-10-CM | POA: Diagnosis not present

## 2020-12-23 DIAGNOSIS — F4310 Post-traumatic stress disorder, unspecified: Secondary | ICD-10-CM | POA: Diagnosis not present

## 2020-12-23 DIAGNOSIS — Z79899 Other long term (current) drug therapy: Secondary | ICD-10-CM | POA: Diagnosis not present

## 2020-12-23 DIAGNOSIS — F1114 Opioid abuse with opioid-induced mood disorder: Secondary | ICD-10-CM | POA: Diagnosis not present

## 2020-12-26 DIAGNOSIS — F1114 Opioid abuse with opioid-induced mood disorder: Secondary | ICD-10-CM | POA: Diagnosis not present

## 2020-12-26 DIAGNOSIS — F4310 Post-traumatic stress disorder, unspecified: Secondary | ICD-10-CM | POA: Diagnosis not present

## 2020-12-26 DIAGNOSIS — Z79899 Other long term (current) drug therapy: Secondary | ICD-10-CM | POA: Diagnosis not present

## 2020-12-30 DIAGNOSIS — F1114 Opioid abuse with opioid-induced mood disorder: Secondary | ICD-10-CM | POA: Diagnosis not present

## 2020-12-30 DIAGNOSIS — F4310 Post-traumatic stress disorder, unspecified: Secondary | ICD-10-CM | POA: Diagnosis not present

## 2021-01-01 DIAGNOSIS — G8929 Other chronic pain: Secondary | ICD-10-CM | POA: Diagnosis not present

## 2021-01-01 DIAGNOSIS — Z79899 Other long term (current) drug therapy: Secondary | ICD-10-CM | POA: Diagnosis not present

## 2021-01-01 DIAGNOSIS — M545 Low back pain, unspecified: Secondary | ICD-10-CM | POA: Diagnosis not present

## 2021-01-01 DIAGNOSIS — M25571 Pain in right ankle and joints of right foot: Secondary | ICD-10-CM | POA: Diagnosis not present

## 2021-01-01 DIAGNOSIS — F1120 Opioid dependence, uncomplicated: Secondary | ICD-10-CM | POA: Diagnosis not present

## 2021-01-01 DIAGNOSIS — Z6822 Body mass index (BMI) 22.0-22.9, adult: Secondary | ICD-10-CM | POA: Diagnosis not present

## 2021-01-02 DIAGNOSIS — F1114 Opioid abuse with opioid-induced mood disorder: Secondary | ICD-10-CM | POA: Diagnosis not present

## 2021-01-02 DIAGNOSIS — F4310 Post-traumatic stress disorder, unspecified: Secondary | ICD-10-CM | POA: Diagnosis not present

## 2021-01-06 DIAGNOSIS — F1120 Opioid dependence, uncomplicated: Secondary | ICD-10-CM | POA: Diagnosis not present

## 2021-01-07 DIAGNOSIS — F411 Generalized anxiety disorder: Secondary | ICD-10-CM | POA: Diagnosis not present

## 2021-01-07 DIAGNOSIS — F4310 Post-traumatic stress disorder, unspecified: Secondary | ICD-10-CM | POA: Diagnosis not present

## 2021-01-07 DIAGNOSIS — Z008 Encounter for other general examination: Secondary | ICD-10-CM | POA: Diagnosis not present

## 2021-01-07 DIAGNOSIS — F331 Major depressive disorder, recurrent, moderate: Secondary | ICD-10-CM | POA: Diagnosis not present

## 2021-01-07 DIAGNOSIS — F1114 Opioid abuse with opioid-induced mood disorder: Secondary | ICD-10-CM | POA: Diagnosis not present

## 2021-01-08 DIAGNOSIS — Z6822 Body mass index (BMI) 22.0-22.9, adult: Secondary | ICD-10-CM | POA: Diagnosis not present

## 2021-01-08 DIAGNOSIS — M25571 Pain in right ankle and joints of right foot: Secondary | ICD-10-CM | POA: Diagnosis not present

## 2021-01-08 DIAGNOSIS — G8929 Other chronic pain: Secondary | ICD-10-CM | POA: Diagnosis not present

## 2021-01-08 DIAGNOSIS — Z79899 Other long term (current) drug therapy: Secondary | ICD-10-CM | POA: Diagnosis not present

## 2021-01-08 DIAGNOSIS — F1510 Other stimulant abuse, uncomplicated: Secondary | ICD-10-CM | POA: Diagnosis not present

## 2021-01-08 DIAGNOSIS — M545 Low back pain, unspecified: Secondary | ICD-10-CM | POA: Diagnosis not present

## 2021-01-09 DIAGNOSIS — F1114 Opioid abuse with opioid-induced mood disorder: Secondary | ICD-10-CM | POA: Diagnosis not present

## 2021-01-09 DIAGNOSIS — F4310 Post-traumatic stress disorder, unspecified: Secondary | ICD-10-CM | POA: Diagnosis not present

## 2021-01-13 DIAGNOSIS — F1114 Opioid abuse with opioid-induced mood disorder: Secondary | ICD-10-CM | POA: Diagnosis not present

## 2021-01-13 DIAGNOSIS — F4310 Post-traumatic stress disorder, unspecified: Secondary | ICD-10-CM | POA: Diagnosis not present

## 2021-01-14 DIAGNOSIS — F1120 Opioid dependence, uncomplicated: Secondary | ICD-10-CM | POA: Diagnosis not present

## 2021-01-16 DIAGNOSIS — F1114 Opioid abuse with opioid-induced mood disorder: Secondary | ICD-10-CM | POA: Diagnosis not present

## 2021-01-16 DIAGNOSIS — F4310 Post-traumatic stress disorder, unspecified: Secondary | ICD-10-CM | POA: Diagnosis not present

## 2021-01-19 DIAGNOSIS — F4310 Post-traumatic stress disorder, unspecified: Secondary | ICD-10-CM | POA: Diagnosis not present

## 2021-01-19 DIAGNOSIS — Z6822 Body mass index (BMI) 22.0-22.9, adult: Secondary | ICD-10-CM | POA: Diagnosis not present

## 2021-01-19 DIAGNOSIS — G8929 Other chronic pain: Secondary | ICD-10-CM | POA: Diagnosis not present

## 2021-01-19 DIAGNOSIS — F1120 Opioid dependence, uncomplicated: Secondary | ICD-10-CM | POA: Diagnosis not present

## 2021-01-19 DIAGNOSIS — F1114 Opioid abuse with opioid-induced mood disorder: Secondary | ICD-10-CM | POA: Diagnosis not present

## 2021-01-19 DIAGNOSIS — Z6821 Body mass index (BMI) 21.0-21.9, adult: Secondary | ICD-10-CM | POA: Diagnosis not present

## 2021-01-19 DIAGNOSIS — Z79899 Other long term (current) drug therapy: Secondary | ICD-10-CM | POA: Diagnosis not present

## 2021-01-19 DIAGNOSIS — M545 Low back pain, unspecified: Secondary | ICD-10-CM | POA: Diagnosis not present

## 2021-01-19 DIAGNOSIS — M25571 Pain in right ankle and joints of right foot: Secondary | ICD-10-CM | POA: Diagnosis not present

## 2021-01-22 DIAGNOSIS — F1120 Opioid dependence, uncomplicated: Secondary | ICD-10-CM | POA: Diagnosis not present

## 2021-01-22 DIAGNOSIS — F4310 Post-traumatic stress disorder, unspecified: Secondary | ICD-10-CM | POA: Diagnosis not present

## 2021-01-22 DIAGNOSIS — F1114 Opioid abuse with opioid-induced mood disorder: Secondary | ICD-10-CM | POA: Diagnosis not present

## 2021-01-28 DIAGNOSIS — Z79899 Other long term (current) drug therapy: Secondary | ICD-10-CM | POA: Diagnosis not present

## 2021-01-28 DIAGNOSIS — M25571 Pain in right ankle and joints of right foot: Secondary | ICD-10-CM | POA: Diagnosis not present

## 2021-01-28 DIAGNOSIS — M545 Low back pain, unspecified: Secondary | ICD-10-CM | POA: Diagnosis not present

## 2021-01-28 DIAGNOSIS — F1120 Opioid dependence, uncomplicated: Secondary | ICD-10-CM | POA: Diagnosis not present

## 2021-01-28 DIAGNOSIS — Z6822 Body mass index (BMI) 22.0-22.9, adult: Secondary | ICD-10-CM | POA: Diagnosis not present

## 2021-01-28 DIAGNOSIS — Z6821 Body mass index (BMI) 21.0-21.9, adult: Secondary | ICD-10-CM | POA: Diagnosis not present

## 2021-01-28 DIAGNOSIS — G8929 Other chronic pain: Secondary | ICD-10-CM | POA: Diagnosis not present

## 2021-01-30 DIAGNOSIS — Z79899 Other long term (current) drug therapy: Secondary | ICD-10-CM | POA: Diagnosis not present

## 2021-02-06 DIAGNOSIS — F4310 Post-traumatic stress disorder, unspecified: Secondary | ICD-10-CM | POA: Diagnosis not present

## 2021-02-06 DIAGNOSIS — F1114 Opioid abuse with opioid-induced mood disorder: Secondary | ICD-10-CM | POA: Diagnosis not present

## 2021-02-11 DIAGNOSIS — M25571 Pain in right ankle and joints of right foot: Secondary | ICD-10-CM | POA: Diagnosis not present

## 2021-02-11 DIAGNOSIS — Z6821 Body mass index (BMI) 21.0-21.9, adult: Secondary | ICD-10-CM | POA: Diagnosis not present

## 2021-02-11 DIAGNOSIS — Z79899 Other long term (current) drug therapy: Secondary | ICD-10-CM | POA: Diagnosis not present

## 2021-02-11 DIAGNOSIS — G8929 Other chronic pain: Secondary | ICD-10-CM | POA: Diagnosis not present

## 2021-02-11 DIAGNOSIS — Z6822 Body mass index (BMI) 22.0-22.9, adult: Secondary | ICD-10-CM | POA: Diagnosis not present

## 2021-02-11 DIAGNOSIS — F1114 Opioid abuse with opioid-induced mood disorder: Secondary | ICD-10-CM | POA: Diagnosis not present

## 2021-02-11 DIAGNOSIS — F4310 Post-traumatic stress disorder, unspecified: Secondary | ICD-10-CM | POA: Diagnosis not present

## 2021-02-11 DIAGNOSIS — M545 Low back pain, unspecified: Secondary | ICD-10-CM | POA: Diagnosis not present

## 2021-02-11 DIAGNOSIS — F1120 Opioid dependence, uncomplicated: Secondary | ICD-10-CM | POA: Diagnosis not present

## 2021-02-12 DIAGNOSIS — F4310 Post-traumatic stress disorder, unspecified: Secondary | ICD-10-CM | POA: Diagnosis not present

## 2021-02-12 DIAGNOSIS — F411 Generalized anxiety disorder: Secondary | ICD-10-CM | POA: Diagnosis not present

## 2021-02-12 DIAGNOSIS — F331 Major depressive disorder, recurrent, moderate: Secondary | ICD-10-CM | POA: Diagnosis not present

## 2021-02-12 DIAGNOSIS — M792 Neuralgia and neuritis, unspecified: Secondary | ICD-10-CM | POA: Diagnosis not present

## 2021-02-13 DIAGNOSIS — Z79899 Other long term (current) drug therapy: Secondary | ICD-10-CM | POA: Diagnosis not present

## 2021-02-14 DIAGNOSIS — F1114 Opioid abuse with opioid-induced mood disorder: Secondary | ICD-10-CM | POA: Diagnosis not present

## 2021-02-14 DIAGNOSIS — F4310 Post-traumatic stress disorder, unspecified: Secondary | ICD-10-CM | POA: Diagnosis not present

## 2021-02-20 DIAGNOSIS — F4310 Post-traumatic stress disorder, unspecified: Secondary | ICD-10-CM | POA: Diagnosis not present

## 2021-02-20 DIAGNOSIS — F1114 Opioid abuse with opioid-induced mood disorder: Secondary | ICD-10-CM | POA: Diagnosis not present

## 2021-02-23 DIAGNOSIS — F1114 Opioid abuse with opioid-induced mood disorder: Secondary | ICD-10-CM | POA: Diagnosis not present

## 2021-02-23 DIAGNOSIS — F4310 Post-traumatic stress disorder, unspecified: Secondary | ICD-10-CM | POA: Diagnosis not present

## 2021-02-25 DIAGNOSIS — M25571 Pain in right ankle and joints of right foot: Secondary | ICD-10-CM | POA: Diagnosis not present

## 2021-02-25 DIAGNOSIS — F4310 Post-traumatic stress disorder, unspecified: Secondary | ICD-10-CM | POA: Diagnosis not present

## 2021-02-25 DIAGNOSIS — G8929 Other chronic pain: Secondary | ICD-10-CM | POA: Diagnosis not present

## 2021-02-25 DIAGNOSIS — R030 Elevated blood-pressure reading, without diagnosis of hypertension: Secondary | ICD-10-CM | POA: Diagnosis not present

## 2021-02-25 DIAGNOSIS — M545 Low back pain, unspecified: Secondary | ICD-10-CM | POA: Diagnosis not present

## 2021-02-25 DIAGNOSIS — Z6821 Body mass index (BMI) 21.0-21.9, adult: Secondary | ICD-10-CM | POA: Diagnosis not present

## 2021-02-25 DIAGNOSIS — Z6822 Body mass index (BMI) 22.0-22.9, adult: Secondary | ICD-10-CM | POA: Diagnosis not present

## 2021-02-25 DIAGNOSIS — F1114 Opioid abuse with opioid-induced mood disorder: Secondary | ICD-10-CM | POA: Diagnosis not present

## 2021-02-25 DIAGNOSIS — F1510 Other stimulant abuse, uncomplicated: Secondary | ICD-10-CM | POA: Diagnosis not present

## 2021-02-25 DIAGNOSIS — Z79899 Other long term (current) drug therapy: Secondary | ICD-10-CM | POA: Diagnosis not present

## 2021-02-27 DIAGNOSIS — Z79899 Other long term (current) drug therapy: Secondary | ICD-10-CM | POA: Diagnosis not present

## 2021-03-11 DIAGNOSIS — F331 Major depressive disorder, recurrent, moderate: Secondary | ICD-10-CM | POA: Diagnosis not present

## 2021-03-11 DIAGNOSIS — F1114 Opioid abuse with opioid-induced mood disorder: Secondary | ICD-10-CM | POA: Diagnosis not present

## 2021-03-11 DIAGNOSIS — R030 Elevated blood-pressure reading, without diagnosis of hypertension: Secondary | ICD-10-CM | POA: Diagnosis not present

## 2021-03-11 DIAGNOSIS — Z6822 Body mass index (BMI) 22.0-22.9, adult: Secondary | ICD-10-CM | POA: Diagnosis not present

## 2021-03-11 DIAGNOSIS — G8929 Other chronic pain: Secondary | ICD-10-CM | POA: Diagnosis not present

## 2021-03-11 DIAGNOSIS — Z6821 Body mass index (BMI) 21.0-21.9, adult: Secondary | ICD-10-CM | POA: Diagnosis not present

## 2021-03-11 DIAGNOSIS — M25571 Pain in right ankle and joints of right foot: Secondary | ICD-10-CM | POA: Diagnosis not present

## 2021-03-11 DIAGNOSIS — Z79899 Other long term (current) drug therapy: Secondary | ICD-10-CM | POA: Diagnosis not present

## 2021-03-11 DIAGNOSIS — F4310 Post-traumatic stress disorder, unspecified: Secondary | ICD-10-CM | POA: Diagnosis not present

## 2021-03-11 DIAGNOSIS — F1120 Opioid dependence, uncomplicated: Secondary | ICD-10-CM | POA: Diagnosis not present

## 2021-03-14 DIAGNOSIS — F4310 Post-traumatic stress disorder, unspecified: Secondary | ICD-10-CM | POA: Diagnosis not present

## 2021-03-14 DIAGNOSIS — F1114 Opioid abuse with opioid-induced mood disorder: Secondary | ICD-10-CM | POA: Diagnosis not present

## 2021-03-16 DIAGNOSIS — Z79899 Other long term (current) drug therapy: Secondary | ICD-10-CM | POA: Diagnosis not present

## 2021-03-25 DIAGNOSIS — Z6821 Body mass index (BMI) 21.0-21.9, adult: Secondary | ICD-10-CM | POA: Diagnosis not present

## 2021-03-25 DIAGNOSIS — M25571 Pain in right ankle and joints of right foot: Secondary | ICD-10-CM | POA: Diagnosis not present

## 2021-03-25 DIAGNOSIS — R030 Elevated blood-pressure reading, without diagnosis of hypertension: Secondary | ICD-10-CM | POA: Diagnosis not present

## 2021-03-25 DIAGNOSIS — F1114 Opioid abuse with opioid-induced mood disorder: Secondary | ICD-10-CM | POA: Diagnosis not present

## 2021-03-25 DIAGNOSIS — Z6822 Body mass index (BMI) 22.0-22.9, adult: Secondary | ICD-10-CM | POA: Diagnosis not present

## 2021-03-25 DIAGNOSIS — M545 Low back pain, unspecified: Secondary | ICD-10-CM | POA: Diagnosis not present

## 2021-03-25 DIAGNOSIS — G8929 Other chronic pain: Secondary | ICD-10-CM | POA: Diagnosis not present

## 2021-03-25 DIAGNOSIS — F4310 Post-traumatic stress disorder, unspecified: Secondary | ICD-10-CM | POA: Diagnosis not present

## 2021-03-25 DIAGNOSIS — Z79899 Other long term (current) drug therapy: Secondary | ICD-10-CM | POA: Diagnosis not present

## 2021-03-27 DIAGNOSIS — Z79899 Other long term (current) drug therapy: Secondary | ICD-10-CM | POA: Diagnosis not present

## 2022-06-03 ENCOUNTER — Other Ambulatory Visit: Payer: Self-pay

## 2022-06-03 ENCOUNTER — Other Ambulatory Visit: Payer: Self-pay | Admitting: Family Medicine

## 2022-06-03 DIAGNOSIS — M25551 Pain in right hip: Secondary | ICD-10-CM

## 2022-06-17 ENCOUNTER — Ambulatory Visit
Admission: RE | Admit: 2022-06-17 | Discharge: 2022-06-17 | Disposition: A | Discharge status: Home / Self Care | Payer: Medicare Other | Source: Ambulatory Visit | Attending: Family Medicine | Admitting: Family Medicine

## 2022-06-17 DIAGNOSIS — M25551 Pain in right hip: Secondary | ICD-10-CM

## 2022-10-05 ENCOUNTER — Encounter (HOSPITAL_COMMUNITY): Payer: Self-pay

## 2022-10-05 ENCOUNTER — Emergency Department (HOSPITAL_COMMUNITY): Payer: Medicare HMO

## 2022-10-05 ENCOUNTER — Emergency Department (HOSPITAL_COMMUNITY)
Admission: EM | Admit: 2022-10-05 | Discharge: 2022-10-05 | Disposition: A | Discharge status: Home / Self Care | Payer: Medicare HMO | Attending: Emergency Medicine | Admitting: Emergency Medicine

## 2022-10-05 ENCOUNTER — Other Ambulatory Visit: Payer: Self-pay

## 2022-10-05 DIAGNOSIS — M545 Low back pain, unspecified: Secondary | ICD-10-CM | POA: Diagnosis present

## 2022-10-05 DIAGNOSIS — I10 Essential (primary) hypertension: Secondary | ICD-10-CM | POA: Insufficient documentation

## 2022-10-05 DIAGNOSIS — G8929 Other chronic pain: Secondary | ICD-10-CM | POA: Insufficient documentation

## 2022-10-05 DIAGNOSIS — J45909 Unspecified asthma, uncomplicated: Secondary | ICD-10-CM | POA: Insufficient documentation

## 2022-10-05 DIAGNOSIS — M5441 Lumbago with sciatica, right side: Secondary | ICD-10-CM | POA: Insufficient documentation

## 2022-10-05 LAB — URINALYSIS, ROUTINE W REFLEX MICROSCOPIC
Bilirubin Urine: NEGATIVE
Glucose, UA: NEGATIVE mg/dL
Hgb urine dipstick: NEGATIVE
Ketones, ur: NEGATIVE mg/dL
Leukocytes,Ua: NEGATIVE
Nitrite: NEGATIVE
Protein, ur: 30 mg/dL — AB
Specific Gravity, Urine: 1.026 (ref 1.005–1.030)
pH: 5 (ref 5.0–8.0)

## 2022-10-05 LAB — BASIC METABOLIC PANEL
Anion gap: 7 (ref 5–15)
BUN: 12 mg/dL (ref 6–20)
CO2: 27 mmol/L (ref 22–32)
Calcium: 8.7 mg/dL — ABNORMAL LOW (ref 8.9–10.3)
Chloride: 105 mmol/L (ref 98–111)
Creatinine, Ser: 0.89 mg/dL (ref 0.61–1.24)
GFR, Estimated: >60 mL/min (ref >60)
Glucose, Bld: 100 mg/dL — ABNORMAL HIGH (ref 70–99)
Potassium: 4.2 mmol/L (ref 3.5–5.1)
Sodium: 139 mmol/L (ref 135–145)

## 2022-10-05 LAB — CBC
HCT: 34.7 % — ABNORMAL LOW (ref 39.0–52.0)
Hemoglobin: 11.2 g/dL — ABNORMAL LOW (ref 13.0–17.0)
MCH: 28.9 pg (ref 26.0–34.0)
MCHC: 32.3 g/dL (ref 30.0–36.0)
MCV: 89.4 fL (ref 80.0–100.0)
Platelets: 165 10*3/uL (ref 150–400)
RBC: 3.88 MIL/uL — ABNORMAL LOW (ref 4.22–5.81)
RDW: 12.5 % (ref 11.5–15.5)
WBC: 7.3 10*3/uL (ref 4.0–10.5)
nRBC: 0 % (ref 0.0–0.2)

## 2022-10-05 MED ORDER — GADOBUTROL 1 MMOL/ML IV SOLN
7.5000 mL | Freq: Once | INTRAVENOUS | Status: AC | PRN
  Administered 2022-10-05: 7.5 mL via INTRAVENOUS

## 2022-10-05 MED ORDER — PREDNISONE 20 MG PO TABS: 40.0000 mg | ORAL_TABLET | Freq: Every day | ORAL | 0 refills | Status: AC

## 2022-10-05 MED ORDER — KETOROLAC TROMETHAMINE 30 MG/ML IJ SOLN
30.0000 mg | Freq: Once | INTRAMUSCULAR | Status: AC
  Administered 2022-10-05: 30 mg via INTRAMUSCULAR
  Filled 2022-10-05: qty 1

## 2022-10-05 NOTE — ED Triage Notes (Signed)
C/o lower back pain that has worsened 3 days ago.  Pain worse with movement.  Pt reports chronic back pain due to needing right hip replacement but awaiting to see orthopedic at [facility] for second opinion.

## 2022-10-05 NOTE — ED Provider Notes (Signed)
[facility] EMERGENCY DEPARTMENT AT [facility] Provider Note   CSN: [id number] Arrival date & time: 10/05/22  1041     History  Chief Complaint  Patient presents with   Back Pain    Douglas Casey is a 42 y.o. male with history of hypertension, asthma, avascular necrosis of right femoral head, secondary arthritis of the right hip who presents the emergency department complaining of lower back pain.  He states this has been going on for the past several days.  He does some lifting at work, but does not know of anything specifically he did hurt his back.  Pain worse with long periods of standing, as well as bending over.  Intermittently has some numbness in the right leg, but some of that is chronic for him.  States that he is waiting to speak to a orthopedic specialist about his hip, and is also been following with another specialist to do scans to evaluate for MS.  No saddle paresthesia, urinary tension, urine or bowel incontinence.   Back Pain Associated symptoms: numbness   Associated symptoms: no fever        Home Medications Prior to Admission medications   Medication Sig Start Date End Date Taking? Authorizing Provider  predniSONE (DELTASONE) 20 MG tablet Take 2 tablets (40 mg total) by mouth daily for 5 days. 10/05/22 10/10/22 Yes Erna Brossard T, PA-C  albuterol (PROVENTIL HFA;VENTOLIN HFA) 108 (90 Base) MCG/ACT inhaler Inhale 2 puffs into the lungs every 6 (six) hours as needed for wheezing or shortness of breath. 01/13/16   Armandina Stammer I, NP  cyclobenzaprine (FLEXERIL) 10 MG tablet Take 1 tablet (10 mg total) by mouth 2 (two) times daily as needed for muscle spasms. 01/25/16   Joy, Shawn C, PA-C  DULoxetine 40 MG CPEP Take 40 mg by mouth daily. 01/16/16   [name], NP  gabapentin (NEURONTIN) 400 MG capsule Take 2 capsules (800 mg total) by mouth 3 (three) times daily. For agitation 01/16/16   [name], NP  hydrOXYzine (ATARAX/VISTARIL) 25 MG tablet Take 1  tablet (25 mg total) by mouth every 6 (six) hours as needed for anxiety. 01/13/16   Armandina Stammer I, NP  hydrOXYzine (ATARAX/VISTARIL) 25 MG tablet Take 1 tablet (25 mg total) by mouth every 6 (six) hours as needed for anxiety. 01/16/16   [name], NP  lidocaine (LIDODERM) 5 % Place 2 patches onto the skin daily. Remove & Discard patch within 12 hours or as directed by MD: For pain management 01/13/16   Armandina Stammer I, NP  naproxen (NAPROSYN) 500 MG tablet Take 1 tablet (500 mg total) by mouth 2 (two) times daily. 01/25/16   Joy, Shawn C, PA-C  nicotine (NICODERM CQ - DOSED IN MG/24 HOURS) 21 mg/24hr patch Place 1 patch (21 mg total) onto the skin daily. For nicotine addiction 01/13/16   Armandina Stammer I, NP  oxyCODONE-acetaminophen (PERCOCET/ROXICET) 5-325 MG tablet Take 1 tablet by mouth every 8 (eight) hours as needed for severe pain. 01/16/16   [name], NP  QUEtiapine (SEROQUEL) 50 MG tablet Take 1 tablet (50 mg total) by mouth at bedtime. For agitation/mood control 01/16/16   [name], NP      Allergies    Bee venom and Shellfish allergy    Review of Systems   Review of Systems  Constitutional:  Negative for chills and fever.  Musculoskeletal:  Positive for back pain.  Neurological:  Positive for numbness.  All other systems reviewed and are negative.  Physical Exam Updated Vital Signs BP 117/74 (BP Location: Right Arm)   Pulse 79   Temp 98 F (36.7 C) (Oral)   Resp 18   Wt 77 kg   SpO2 96%   BMI 24.36 kg/m  Physical Exam Vitals and nursing note reviewed.  Constitutional:      Appearance: Normal appearance.  HENT:     Head: Normocephalic and atraumatic.  Eyes:     Conjunctiva/sclera: Conjunctivae normal.  Pulmonary:     Effort: Pulmonary effort is normal. No respiratory distress.  Musculoskeletal:     Comments: Generalized paraspinal muscular tenderness to palpation, worse in lumbar region, R > L.  No midline spinal tenderness, step-offs or crepitus.   Strength 5/5 in all extremities.  Sensation intact in all extremities.  Skin:    General: Skin is warm and dry.  Neurological:     Mental Status: He is alert.  Psychiatric:        Mood and Affect: Mood normal.        Behavior: Behavior normal.     ED Results / Procedures / Treatments   Labs (all labs ordered are listed, but only abnormal results are displayed) Labs Reviewed  URINALYSIS, ROUTINE W REFLEX MICROSCOPIC - Abnormal; Notable for the following components:      Result Value   Protein, ur 30 (*)    Bacteria, UA RARE (*)    All other components within normal limits  CBC - Abnormal; Notable for the following components:   RBC 3.88 (*)    Hemoglobin 11.2 (*)    HCT 34.7 (*)    All other components within normal limits  BASIC METABOLIC PANEL - Abnormal; Notable for the following components:   Glucose, Bld 100 (*)    Calcium 8.7 (*)    All other components within normal limits    EKG None  Radiology DG Lumbar Spine Complete  Result Date: 10/05/2022 CLINICAL DATA:  Acute lower back pain. EXAM: LUMBAR SPINE - COMPLETE 4+ VIEW COMPARISON:  January 10, 2013. FINDINGS: There is no evidence of lumbar spine fracture. Alignment is normal. Intervertebral disc spaces are maintained. IMPRESSION: Negative. Electronically Signed   By: Lupita RaiderJames  Green Jr M.D.   On: 10/05/2022 12:03    Procedures Procedures    Medications Ordered in ED Medications  ketorolac (TORADOL) 30 MG/ML injection 30 mg (30 mg Intramuscular Given 10/05/22 1255)    ED Course/ Medical Decision Making/ A&P                             Medical Decision Making Amount and/or Complexity of Data Reviewed Labs: ordered. Radiology: ordered.  Risk Prescription drug management.  This patient is a 42 y.o. male who presents to the ED for concern of back pain.   Differential diagnoses prior to evaluation: Fracture (acute/chronic), muscle strain, cauda equina, spinal stenosis, DDD, ligamentous injury, disk herniation,  metastatic cancer, vertebral osteomyelitis, kidney stone, pyelonephritis, AAA, pancreatitis, bowel obstruction, meningitis.  Past Medical History / Social History / Additional history: Chart reviewed. Pertinent results include: hypertension, asthma, avascular necrosis of right femoral head, secondary arthritis of the right hip, hx of IV drug use, pt reports he has not used in years  PDMP reviewed and consistent with patient report. On buprenorphine, gabapentin, klonopin, and tramadol.   Physical Exam: Physical exam performed. The pertinent findings include: Tenderness to palpation of the generalized lower thoracic and lumbar paraspinal area including musculature.  No significant point  tenderness to midline, step-offs or crepitus.  Ambulating with cane, baseline.  Normal sensation of the bilateral lower extremities.  Medications / Treatment: Given Toradol  Labs/Imaging: Workup was initiated in the setting of a prior IV drug use, and ongoing nonspecific atraumatic back pain.  Findings include: No leukocytosis, stable hemoglobin.  BMP unremarkable.  Urinalysis unremarkable.  MRI of thoracic and lumbar spine pending at time of shift change.   Disposition: Patient discussed and care transferred to [facility] at shift change. Please see his/her note for further details regarding further ED course and disposition. Plan at time of handoff is follow-up on MRI imaging.  Anticipate if imaging is normal, patient can discharge to home with plan for burst of steroids and follow-up with primary doctor.  Patient agreeable to this plan..   Final Clinical Impression(s) / ED Diagnoses Final diagnoses:  Acute midline low back pain with right-sided sciatica  Chronic right hip pain    Rx / DC Orders ED Discharge Orders          Ordered    predniSONE (DELTASONE) 20 MG tablet  Daily        10/05/22 1243           Portions of this report may have been transcribed using voice recognition software.  Every effort was made to ensure accuracy; however, inadvertent computerized transcription errors may be present.    Jeanella Flattery 10/05/22 1518    Maia Plan, MD 10/06/22 1213

## 2022-10-05 NOTE — ED Provider Notes (Cosign Needed Addendum)
Patient given in sign out by [employer], PA-C.  Please review their note for patient HPI, physical exam, workup.  At this time the plan is to follow-up on patient's MRI and if negative patient may be discharged with outpatient follow-up with steroids.  If MR shows any abnormalities neurosurgery will be consulted.  MRI came back negative for any epidural abscess or life-threatening pathology.  I updated the patient on the MR and stated will be following the original plan for patient will be prescribed prednisone with outpatient follow-up with his primary care provider.  All patient's questions were answered to his satisfaction.  Patient stable for discharge at this time.   Remi Deter 10/05/22 2042    Maia Plan, MD 10/06/22 1213

## 2022-10-05 NOTE — Discharge Instructions (Addendum)
You were seen in the emergency department today for back pain.   I suspect this is likely caused by inflammation of the muscles and nerves surrounding your lower spine. Your urine sample was normal, and your x-ray did not show any broken, dislocated, or malaligned bones.   Rest, gentle exercise and stretching will aid in recovery from any injuries to your back.  Using medication such as Tylenol and ibuprofen will help alleviate pain as well as decrease swelling and inflammation associated with these injuries. You may use 600 mg ibuprofen every 6 hours or 1000 mg of Tylenol every 6 hours.  You may choose to alternate between the 2.  This would be most effective.  Not to exceed 4 g of Tylenol within 24 hours.  Not to exceed 3200 mg ibuprofen 24 hours.  Today your MRI was negative for any signs of infection or life-threatening diagnoses.  I have prescribed you: a course of steroids.  Salt water/Epson salt soaks, massage, icy hot/Biofreeze/BenGay and other similar products can help with symptoms.  Please return to the emergency department for reevaluation if you denies any new or concerning symptoms such as fever, new numbness or weakness in your legs, difficulty using the restroom or incontinence.

## 2024-05-30 ENCOUNTER — Other Ambulatory Visit (HOSPITAL_BASED_OUTPATIENT_CLINIC_OR_DEPARTMENT_OTHER): Payer: Self-pay

## 2024-05-30 MED ORDER — OXYCODONE-ACETAMINOPHEN 7.5-325 MG PO TABS
2.0000 | ORAL_TABLET | Freq: Three times a day (TID) | ORAL | 0 refills | Status: DC | PRN
  Filled 2024-05-30: qty 180, 30d supply, fill #0

## 2024-07-06 ENCOUNTER — Other Ambulatory Visit (HOSPITAL_BASED_OUTPATIENT_CLINIC_OR_DEPARTMENT_OTHER): Payer: Self-pay

## 2024-07-06 MED ORDER — AMPHETAMINE-DEXTROAMPHETAMINE 30 MG PO TABS
30.0000 mg | ORAL_TABLET | Freq: Two times a day (BID) | ORAL | 0 refills | Status: AC
  Filled 2024-07-06: qty 60, 30d supply, fill #0

## 2024-07-06 MED ORDER — OXYCODONE-ACETAMINOPHEN 7.5-325 MG PO TABS
2.0000 | ORAL_TABLET | Freq: Three times a day (TID) | ORAL | 0 refills | Status: AC | PRN
  Filled 2024-07-06: qty 180, 30d supply, fill #0
  Filled 2024-07-06: qty 42, 7d supply, fill #0
